# Patient Record
Sex: Male | Born: 1995 | Race: White | Hispanic: No | Marital: Single | Smoking: Never smoker
Health system: Southern US, Community
[De-identification: ages and names within clinical notes are randomized; demographics above are authoritative.]

## PROBLEM LIST (undated history)

## (undated) DIAGNOSIS — G039 Meningitis, unspecified: Secondary | ICD-10-CM

## (undated) DIAGNOSIS — J45909 Unspecified asthma, uncomplicated: Secondary | ICD-10-CM

## (undated) DIAGNOSIS — E119 Type 2 diabetes mellitus without complications: Secondary | ICD-10-CM

## (undated) DIAGNOSIS — I514 Myocarditis, unspecified: Secondary | ICD-10-CM

## (undated) DIAGNOSIS — F845 Asperger's syndrome: Secondary | ICD-10-CM

## (undated) HISTORY — PX: TONSILLECTOMY: SUR1361

## (undated) HISTORY — DX: Asperger's syndrome: F84.5

---

## 2018-03-08 ENCOUNTER — Emergency Department (HOSPITAL_COMMUNITY)
Admission: EM | Admit: 2018-03-08 | Discharge: 2018-03-08 | Disposition: A | Discharge status: Home / Self Care | Payer: Self-pay | Attending: Emergency Medicine | Admitting: Emergency Medicine

## 2018-03-08 ENCOUNTER — Encounter (HOSPITAL_COMMUNITY): Payer: Self-pay | Admitting: Obstetrics and Gynecology

## 2018-03-08 ENCOUNTER — Other Ambulatory Visit: Payer: Self-pay

## 2018-03-08 ENCOUNTER — Emergency Department (HOSPITAL_COMMUNITY): Payer: Self-pay

## 2018-03-08 DIAGNOSIS — J45909 Unspecified asthma, uncomplicated: Secondary | ICD-10-CM | POA: Insufficient documentation

## 2018-03-08 DIAGNOSIS — J209 Acute bronchitis, unspecified: Secondary | ICD-10-CM | POA: Insufficient documentation

## 2018-03-08 HISTORY — DX: Meningitis, unspecified: G03.9

## 2018-03-08 HISTORY — DX: Myocarditis, unspecified: I51.4

## 2018-03-08 HISTORY — DX: Unspecified asthma, uncomplicated: J45.909

## 2018-03-08 MED ORDER — HYDROCOD POLST-CPM POLST ER 10-8 MG/5ML PO SUER
5.0000 mL | Freq: Once | ORAL | Status: AC
  Administered 2018-03-08: 5 mL via ORAL
  Filled 2018-03-08: qty 5

## 2018-03-08 MED ORDER — ALBUTEROL SULFATE HFA 108 (90 BASE) MCG/ACT IN AERS
2.0000 | INHALATION_SPRAY | Freq: Once | RESPIRATORY_TRACT | Status: AC
  Administered 2018-03-08: 2 via RESPIRATORY_TRACT
  Filled 2018-03-08: qty 6.7

## 2018-03-08 MED ORDER — HYDROCODONE-HOMATROPINE 5-1.5 MG/5ML PO SYRP: 5.0000 mL | ORAL_SOLUTION | Freq: Four times a day (QID) | ORAL | 0 refills | Status: DC | PRN

## 2018-03-08 MED ORDER — ALBUTEROL SULFATE (2.5 MG/3ML) 0.083% IN NEBU
5.0000 mg | INHALATION_SOLUTION | Freq: Once | RESPIRATORY_TRACT | Status: AC
  Administered 2018-03-08: 5 mg via RESPIRATORY_TRACT
  Filled 2018-03-08: qty 6

## 2018-03-08 NOTE — Discharge Instructions (Addendum)
Do not drive when taking the cough medication as it will make you sleepy. Return as needed for worsening symptoms.

## 2018-03-08 NOTE — ED Provider Notes (Signed)
Medical screening examination/treatment/procedure(s) were conducted as a shared visit with non-physician practitioner(s) and myself.  I personally evaluated the patient during the encounter.  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  He has done well since then.  He does not have other chronic medical problems.  He developed harsh cough and fever over the weekend and up until yesterday.  There was some vomiting over the weekend as well.  Patient was taking fluids and ibuprofen and Tylenol for presumed flulike illness.  He has not had any fever today but continues to have very harsh deep cough.  He reports is making it hard for him to sleep.  He reports the chest pain is mild and not anything similar to when he had pericarditis.  Patient is alert and appropriate.  No respiratory distress.  Color is good.  He does have dry cough paroxysmal's intermittently.  Lungs are clear.  (I am examining the patient after he had a albuterol nebulizer which reportedly helped quite a bit).  Chest x-ray shows no acute findings.  Appearance is consistent with an acute viral bronchitis/flulike illness.  Patient is already greater than 4 days of symptoms.  Will continue symptomatic treatment with an inhaler, Hycodan and ibuprofen and Tylenol.  Return precautions reviewed.   Arby BarrettePfeiffer, Armon Orvis, MD 03/08/18 2129

## 2018-03-08 NOTE — ED Notes (Signed)
Bed: WLPT4 Expected date:  Expected time:  Means of arrival:  Comments: 

## 2018-03-08 NOTE — ED Notes (Signed)
Bed: [id number] Expected date:  Expected time:  Means of arrival:  Comments: SOB-no distress

## 2018-03-08 NOTE — ED Triage Notes (Signed)
Pt reports he is having a cough and flu like symptoms. Pt's mother is concerned about pneumonia and reports decreased lung sounds. Pt reports a fever of 103 Monday and a 102 fever yesterday.  Pt has a hx of myocarditis and pericarditis.

## 2018-03-08 NOTE — ED Provider Notes (Signed)
[facility]-EMERGENCY DEPT Provider Note   CSN: [id number] Arrival date & time: 03/08/18  1948     History   Chief Complaint Chief Complaint  Patient presents with  . Cough    HPI Wayne Cook is a 22 y.o. male who presents to the ED with his mother for cough, congestion and shortness of breath. Temp up to 103 yesterday. Hx of myocarditis, pericarditis, meningitis and asthma.   HPI  Past Medical History:  Diagnosis Date  . Asthma   . Meningitis   . Myocarditis (HCC)     There are no active problems to display for this patient.   Home Medications    Prior to Admission medications   Medication Sig Start Date End Date Taking? Authorizing Provider  HYDROcodone-homatropine (HYCODAN) 5-1.5 MG/5ML syrup Take 5 mLs by mouth every 6 (six) hours as needed for cough. 03/08/18   Janne Napoleon, NP    Family History No family history on file.  Social History Social History   Tobacco Use  . Smoking status: Never Smoker  . Smokeless tobacco: Never Used  Substance Use Topics  . Alcohol use: Yes    Comment: Social  . Drug use: Not Currently     Allergies   Patient has no allergy information on record.   Review of Systems Review of Systems  Constitutional: Positive for chills and fever.  HENT: Positive for congestion.   Respiratory: Positive for cough and shortness of breath.   Cardiovascular: Negative for chest pain.  Gastrointestinal: Negative for abdominal pain, nausea and vomiting.  Musculoskeletal: Positive for myalgias.  Skin: Negative for rash.  Neurological: Negative for light-headedness.  Psychiatric/Behavioral: Negative for confusion.     Physical Exam Updated Vital Signs BP 130/75 (BP Location: Right Arm)   Pulse (!) 106   Temp 97.9 F (36.6 C) (Oral)   Resp 17   Ht 5\' 11"  (1.803 m)   Wt (!) 140.6 kg   SpO2 98%   BMI 43.24 kg/m   Physical Exam  Constitutional: He appears well-developed and well-nourished. No distress.    HENT:  Head: Normocephalic and atraumatic.  Right Ear: Tympanic membrane normal.  Left Ear: Tympanic membrane normal.  Nose: Rhinorrhea present.  Mouth/Throat: Oropharynx is clear and moist.  Eyes: Conjunctivae and EOM are normal.  Neck: Normal range of motion. Neck supple.  Cardiovascular: Tachycardia present.  Pulmonary/Chest: Effort normal. He has no wheezes. He exhibits no tenderness.  Examined after breathing treatment.  Abdominal: Soft. There is no tenderness.  Musculoskeletal: Normal range of motion.  Lymphadenopathy:    He has no cervical adenopathy.  Neurological: He is alert.  Skin: Skin is warm and dry.  Psychiatric: He has a normal mood and affect.  Nursing note and vitals reviewed.    ED Treatments / Results  Labs (all labs ordered are listed, but only abnormal results are displayed) Labs Reviewed - No data to display Radiology Dg Chest 2 View  Result Date: 03/08/2018 CLINICAL DATA:  Cough. EXAM: CHEST - 2 VIEW COMPARISON:  None. FINDINGS: The heart size and mediastinal contours are within normal limits. Both lungs are clear. The visualized skeletal structures are unremarkable. IMPRESSION: No active cardiopulmonary disease. Electronically Signed   By: Gerome Sam III M.D   On: 03/08/2018 20:57    Procedures Procedures (including critical care time)  Medications Ordered in ED Medications  albuterol (PROVENTIL) (2.5 MG/3ML) 0.083% nebulizer solution 5 mg (5 mg Nebulization Given 03/08/18 2015)  albuterol (PROVENTIL HFA;VENTOLIN HFA) 108 (  90 Base) MCG/ACT inhaler 2 puff (2 puffs Inhalation Given 03/08/18 2152)  chlorpheniramine-HYDROcodone (TUSSIONEX) 10-8 MG/5ML suspension 5 mL (5 mLs Oral Given 03/08/18 2151)     Initial Impression / Assessment and Plan / ED Course  I have reviewed the triage vital signs and the nursing notes. 22 y.o. male with hx of asthma here tonight with shortness of breath that improved with neb treatment. Patient stable for d/c with  normal chest x-ray and O2 SAT 98%  Final Clinical Impressions(s) / ED Diagnoses   Final diagnoses:  Acute bronchitis, unspecified organism    ED Discharge Orders         Ordered    HYDROcodone-homatropine (HYCODAN) 5-1.5 MG/5ML syrup  Every 6 hours PRN     03/08/18 2129           Kerrie Buffaloeese, Anahit Klumb Spring MountM, NP 03/08/18 2218    Arby BarrettePfeiffer, Marcy, MD 03/09/18 2203

## 2018-06-22 ENCOUNTER — Emergency Department (HOSPITAL_COMMUNITY): Payer: No Typology Code available for payment source

## 2018-06-22 ENCOUNTER — Encounter (HOSPITAL_COMMUNITY): Payer: Self-pay | Admitting: Emergency Medicine

## 2018-06-22 ENCOUNTER — Emergency Department (HOSPITAL_COMMUNITY)
Admission: EM | Admit: 2018-06-22 | Discharge: 2018-06-22 | Disposition: A | Discharge status: Home / Self Care | Payer: No Typology Code available for payment source | Attending: Emergency Medicine | Admitting: Emergency Medicine

## 2018-06-22 ENCOUNTER — Other Ambulatory Visit: Payer: Self-pay

## 2018-06-22 DIAGNOSIS — J45909 Unspecified asthma, uncomplicated: Secondary | ICD-10-CM | POA: Diagnosis not present

## 2018-06-22 DIAGNOSIS — R0789 Other chest pain: Secondary | ICD-10-CM | POA: Diagnosis not present

## 2018-06-22 DIAGNOSIS — R079 Chest pain, unspecified: Secondary | ICD-10-CM | POA: Diagnosis present

## 2018-06-22 LAB — I-STAT TROPONIN, ED: Troponin i, poc: 0 ng/mL (ref 0.00–0.08)

## 2018-06-22 LAB — BASIC METABOLIC PANEL
Anion gap: 11 (ref 5–15)
BUN: 8 mg/dL (ref 6–20)
CHLORIDE: 102 mmol/L (ref 98–111)
CO2: 23 mmol/L (ref 22–32)
Calcium: 9.9 mg/dL (ref 8.9–10.3)
Creatinine, Ser: 0.7 mg/dL (ref 0.61–1.24)
GFR calc Af Amer: >60 mL/min (ref >60)
GFR calc non Af Amer: >60 mL/min (ref >60)
Glucose, Bld: 147 mg/dL — ABNORMAL HIGH (ref 70–99)
Potassium: 4.1 mmol/L (ref 3.5–5.1)
Sodium: 136 mmol/L (ref 135–145)

## 2018-06-22 LAB — CBC
HCT: 47.4 % (ref 39.0–52.0)
HEMOGLOBIN: 15.3 g/dL (ref 13.0–17.0)
MCH: 26.9 pg (ref 26.0–34.0)
MCHC: 32.3 g/dL (ref 30.0–36.0)
MCV: 83.5 fL (ref 80.0–100.0)
Platelets: 323 10*3/uL (ref 150–400)
RBC: 5.68 MIL/uL (ref 4.22–5.81)
RDW: 13 % (ref 11.5–15.5)
WBC: 10.7 10*3/uL — ABNORMAL HIGH (ref 4.0–10.5)
nRBC: 0 % (ref 0.0–0.2)

## 2018-06-22 LAB — D-DIMER, QUANTITATIVE: D-Dimer, Quant: <0.27 ug/mL-FEU (ref 0.00–0.50)

## 2018-06-22 MED ORDER — SODIUM CHLORIDE 0.9% FLUSH: 3.0000 mL | Freq: Once | INTRAVENOUS | Status: DC

## 2018-06-22 MED ORDER — ACETAMINOPHEN 500 MG PO TABS
1000.0000 mg | ORAL_TABLET | Freq: Once | ORAL | Status: AC
  Administered 2018-06-22: 1000 mg via ORAL
  Filled 2018-06-22: qty 2

## 2018-06-22 NOTE — ED Triage Notes (Signed)
  Patient comes in with chest pain that started around 1300 this afternoon while playing video games.  Patient states it is in his left chest and does not radiate. No N/V.   Patient was admitted for endocarditis 3 years ago and states it feels like the same.  Patient is A&O x4.

## 2018-06-22 NOTE — Discharge Instructions (Addendum)
May take Tylenol or ibuprofen as needed for pain.  Return immediately for worsening pain, shortness of breath, fever or for any concerns.

## 2018-06-22 NOTE — ED Provider Notes (Addendum)
[facility] EMERGENCY DEPARTMENT Provider Note   CSN: [id number] Arrival date & time: 06/22/18  2009    History   Chief Complaint Chief Complaint  Patient presents with  . Chest Pain    HPI Joh Droge is a 23 y.o. male.     HPI Patient with acute onset left-sided chest pain which started this afternoon at 1400 while playing video games.  Described the pain as sharp and nonradiating.  Denies shortness of breath, cough, fever or chills.  States the pain has improved since being in the emergency department.  No new lower extremity swelling or pain.  No recent extended travel or immobilization.  Patient does have family history of PE.  Has personal history of myocarditis after URI several years ago. Past Medical History:  Diagnosis Date  . Asthma   . Meningitis   . Myocarditis (HCC)     There are no active problems to display for this patient.   Past Surgical History:  Procedure Laterality Date  . TONSILLECTOMY          Home Medications    Prior to Admission medications   Medication Sig Start Date End Date Taking? Authorizing Provider  HYDROcodone-homatropine (HYCODAN) 5-1.5 MG/5ML syrup Take 5 mLs by mouth every 6 (six) hours as needed for cough. Patient not taking: Reported on 06/22/2018 03/08/18   Janne Napoleon, NP    Family History History reviewed. No pertinent family history.  Social History Social History   Tobacco Use  . Smoking status: Never Smoker  . Smokeless tobacco: Never Used  Substance Use Topics  . Alcohol use: Yes    Comment: Social  . Drug use: Not Currently     Allergies   Patient has no known allergies.   Review of Systems Review of Systems  Constitutional: Negative for chills and fever.  HENT: Negative for rhinorrhea, sinus pressure, sore throat and trouble swallowing.   Eyes: Negative for visual disturbance.  Respiratory: Negative for cough and shortness of breath.   Cardiovascular: Positive for chest pain.  Negative for palpitations and leg swelling.  Gastrointestinal: Negative for abdominal pain, diarrhea, nausea and vomiting.  Genitourinary: Negative for dysuria, flank pain and frequency.  Musculoskeletal: Negative for back pain, myalgias and neck pain.  Skin: Negative for rash and wound.  Neurological: Negative for dizziness, weakness, light-headedness, numbness and headaches.  All other systems reviewed and are negative.    Physical Exam Updated Vital Signs BP (!) 108/36   Pulse 91   Temp 98 F (36.7 C) (Oral)   Resp (!) 21   Ht 5\' 11"  (1.803 m)   Wt (!) 140.6 kg   SpO2 97%   BMI 43.24 kg/m   Physical Exam Vitals signs and nursing note reviewed.  Constitutional:      Appearance: Normal appearance. He is well-developed.  HENT:     Head: Normocephalic and atraumatic.     Nose: Nose normal.     Mouth/Throat:     Mouth: Mucous membranes are moist.     Pharynx: No oropharyngeal exudate or posterior oropharyngeal erythema.  Eyes:     Extraocular Movements: Extraocular movements intact.     Pupils: Pupils are equal, round, and reactive to light.  Neck:     Musculoskeletal: Normal range of motion and neck supple. No neck rigidity or muscular tenderness.  Cardiovascular:     Rate and Rhythm: Normal rate and regular rhythm.     Heart sounds: No murmur. No friction rub. No gallop.  Pulmonary:     Effort: Pulmonary effort is normal. No respiratory distress.     Breath sounds: Normal breath sounds. No stridor. No wheezing, rhonchi or rales.     Comments: Patient with left parasternal tenderness to palpation.  No crepitance or deformity. Chest:     Chest wall: Tenderness present.  Abdominal:     General: Bowel sounds are normal.     Palpations: Abdomen is soft.     Tenderness: There is no abdominal tenderness. There is no right CVA tenderness, left CVA tenderness, guarding or rebound.  Musculoskeletal: Normal range of motion.        General: No swelling, tenderness, deformity  or signs of injury.     Right lower leg: No edema.     Left lower leg: No edema.     Comments: No lower extremity swelling, asymmetry or tenderness.  Lymphadenopathy:     Cervical: No cervical adenopathy.  Skin:    General: Skin is warm and dry.     Capillary Refill: Capillary refill takes less than 2 seconds.     Findings: No erythema or rash.  Neurological:     General: No focal deficit present.     Mental Status: He is alert and oriented to person, place, and time.  Psychiatric:        Mood and Affect: Mood normal.        Behavior: Behavior normal.      ED Treatments / Results  Labs (all labs ordered are listed, but only abnormal results are displayed) Labs Reviewed  BASIC METABOLIC PANEL - Abnormal; Notable for the following components:      Result Value   Glucose, Bld 147 (*)    All other components within normal limits  CBC - Abnormal; Notable for the following components:   WBC 10.7 (*)    All other components within normal limits  D-DIMER, QUANTITATIVE (NOT AT [facility])  I-STAT TROPONIN, ED    EKG None  Radiology Dg Chest 2 View  Result Date: 06/22/2018 CLINICAL DATA:  Chest pain EXAM: CHEST - 2 VIEW COMPARISON:  March 08, 2018 FINDINGS: Lungs are clear. The heart size and pulmonary vascularity are normal. No adenopathy. No pneumothorax. No bone lesions. IMPRESSION: No edema or consolidation. Electronically Signed   By: Bretta BangWilliam  Woodruff III M.D.   On: 06/22/2018 21:19    Procedures Procedures (including critical care time)  Medications Ordered in ED Medications  sodium chloride flush (NS) 0.9 % injection 3 mL (3 mLs Intravenous Not Given 06/22/18 2217)  acetaminophen (TYLENOL) tablet 1,000 mg (1,000 mg Oral Given 06/22/18 2208)     Initial Impression / Assessment and Plan / ED Course  I have reviewed the triage vital signs and the nursing notes.  Pertinent labs & imaging results that were available during my care of the patient were reviewed by me and  considered in my medical decision making (see chart for details).        Troponin was drawn 7 hours after onset of pain.  Single negative sufficient to rule out MI.  EKG without ischemic findings.  D-dimer is normal.  Mild elevation in white blood cell count of uncertain significance.  Question possible costochondritis.  Advised NSAID/Tylenol treatment at home.  Return precautions given.  Final Clinical Impressions(s) / ED Diagnoses   Final diagnoses:  Chest wall pain    ED Discharge Orders    None       Loren RacerYelverton, Brewer Hitchman, MD 06/22/18 2149    Ranae PalmsYelverton,  Onalee Hua, MD 06/22/18 2256

## 2018-07-02 ENCOUNTER — Encounter (HOSPITAL_COMMUNITY): Payer: Self-pay

## 2018-07-02 ENCOUNTER — Other Ambulatory Visit: Payer: Self-pay

## 2018-07-02 ENCOUNTER — Emergency Department (HOSPITAL_COMMUNITY)
Admission: EM | Admit: 2018-07-02 | Discharge: 2018-07-02 | Disposition: A | Discharge status: Home / Self Care | Payer: No Typology Code available for payment source | Attending: Emergency Medicine | Admitting: Emergency Medicine

## 2018-07-02 DIAGNOSIS — J45909 Unspecified asthma, uncomplicated: Secondary | ICD-10-CM | POA: Diagnosis not present

## 2018-07-02 DIAGNOSIS — E1165 Type 2 diabetes mellitus with hyperglycemia: Secondary | ICD-10-CM | POA: Diagnosis not present

## 2018-07-02 DIAGNOSIS — N3000 Acute cystitis without hematuria: Secondary | ICD-10-CM | POA: Diagnosis not present

## 2018-07-02 DIAGNOSIS — R350 Frequency of micturition: Secondary | ICD-10-CM | POA: Diagnosis present

## 2018-07-02 DIAGNOSIS — R739 Hyperglycemia, unspecified: Secondary | ICD-10-CM

## 2018-07-02 LAB — BASIC METABOLIC PANEL
Anion gap: 9 (ref 5–15)
BUN: 16 mg/dL (ref 6–20)
CO2: 28 mmol/L (ref 22–32)
Calcium: 9.6 mg/dL (ref 8.9–10.3)
Chloride: 98 mmol/L (ref 98–111)
Creatinine, Ser: 0.81 mg/dL (ref 0.61–1.24)
GFR calc Af Amer: >60 mL/min (ref >60)
GLUCOSE: 330 mg/dL — AB (ref 70–99)
Potassium: 4.5 mmol/L (ref 3.5–5.1)
Sodium: 135 mmol/L (ref 135–145)

## 2018-07-02 LAB — URINALYSIS, ROUTINE W REFLEX MICROSCOPIC
Bacteria, UA: NONE SEEN
Bilirubin Urine: NEGATIVE
Hgb urine dipstick: NEGATIVE
Ketones, ur: NEGATIVE mg/dL
Leukocytes,Ua: NEGATIVE
Nitrite: POSITIVE — AB
Protein, ur: NEGATIVE mg/dL
Specific Gravity, Urine: 1.024 (ref 1.005–1.030)
pH: 6 (ref 5.0–8.0)

## 2018-07-02 LAB — CBC WITH DIFFERENTIAL/PLATELET
Abs Immature Granulocytes: 0.04 10*3/uL (ref 0.00–0.07)
BASOS PCT: 1 %
Basophils Absolute: 0.1 10*3/uL (ref 0.0–0.1)
EOS ABS: 0.2 10*3/uL (ref 0.0–0.5)
Eosinophils Relative: 2 %
HCT: 46.8 % (ref 39.0–52.0)
Hemoglobin: 15 g/dL (ref 13.0–17.0)
Immature Granulocytes: 0 %
Lymphocytes Relative: 30 %
Lymphs Abs: 3.5 10*3/uL (ref 0.7–4.0)
MCH: 27.6 pg (ref 26.0–34.0)
MCHC: 32.1 g/dL (ref 30.0–36.0)
MCV: 86.2 fL (ref 80.0–100.0)
MONOS PCT: 7 %
Monocytes Absolute: 0.8 10*3/uL (ref 0.1–1.0)
Neutro Abs: 6.9 10*3/uL (ref 1.7–7.7)
Neutrophils Relative %: 60 %
PLATELETS: 331 10*3/uL (ref 150–400)
RBC: 5.43 MIL/uL (ref 4.22–5.81)
RDW: 13.3 % (ref 11.5–15.5)
WBC: 11.6 10*3/uL — ABNORMAL HIGH (ref 4.0–10.5)
nRBC: 0 % (ref 0.0–0.2)

## 2018-07-02 LAB — CBG MONITORING, ED: Glucose-Capillary: 334 mg/dL — ABNORMAL HIGH (ref 70–99)

## 2018-07-02 MED ORDER — CEPHALEXIN 500 MG PO CAPS: 500.0000 mg | ORAL_CAPSULE | Freq: Three times a day (TID) | ORAL | 0 refills | Status: DC

## 2018-07-02 MED ORDER — BLOOD GLUCOSE MONITOR KIT: PACK | 0 refills | Status: AC

## 2018-07-02 MED ORDER — CEPHALEXIN 500 MG PO CAPS
500.0000 mg | ORAL_CAPSULE | Freq: Once | ORAL | Status: AC
  Administered 2018-07-02: 500 mg via ORAL
  Filled 2018-07-02: qty 1

## 2018-07-02 MED ORDER — METFORMIN HCL 500 MG PO TABS: 500.0000 mg | ORAL_TABLET | Freq: Two times a day (BID) | ORAL | 0 refills | Status: AC

## 2018-07-02 NOTE — ED Notes (Signed)
Bed: HW38 Expected date:  Expected time:  Means of arrival:  Comments: Patient coming in

## 2018-07-02 NOTE — ED Notes (Signed)
Patient denies pain and is resting comfortably.  

## 2018-07-02 NOTE — Discharge Instructions (Addendum)
Begin taking Keflex and Metformin as prescribed this evening.  Check your blood sugars twice daily and keep a record of this so that you can discuss the results with your primary doctor in follow-up.    Watch your sugar and carbohydrate intake and discuss weight loss strategies with your primary doctor.

## 2018-07-02 NOTE — ED Provider Notes (Signed)
[facility]-EMERGENCY DEPT Provider Note   CSN: [id number] Arrival date & time: 07/02/18  0223    History   Chief Complaint Chief Complaint  Patient presents with  . Urinary Frequency    HPI Wayne Cook is a 23 y.o. male.     Patient is a 23 year old male with history of asthma and myocarditis.  He presents today for evaluation of urinary frequency.  Patient states that beginning this evening he has urinated once every hour.  Every time he lies back, he gets the sudden urge to void.  He is voiding in small amounts.  He denies any fevers or chills.  He denies any burning with urination.  The history is provided by the patient.  Urinary Frequency  This is a new problem. The current episode started 3 to 5 hours ago. The problem occurs constantly. The problem has not changed since onset.Exacerbated by: Lying flat. Nothing relieves the symptoms. Treatments tried: Pyridium. The treatment provided no relief.    Past Medical History:  Diagnosis Date  . Asthma   . Meningitis   . Myocarditis (HCC)     There are no active problems to display for this patient.   Past Surgical History:  Procedure Laterality Date  . TONSILLECTOMY          Home Medications    Prior to Admission medications   Medication Sig Start Date End Date Taking? Authorizing Provider  HYDROcodone-homatropine (HYCODAN) 5-1.5 MG/5ML syrup Take 5 mLs by mouth every 6 (six) hours as needed for cough. Patient not taking: Reported on 06/22/2018 03/08/18   Janne Napoleon, NP    Family History History reviewed. No pertinent family history.  Social History Social History   Tobacco Use  . Smoking status: Never Smoker  . Smokeless tobacco: Never Used  Substance Use Topics  . Alcohol use: Yes    Comment: Social  . Drug use: Not Currently     Allergies   Patient has no known allergies.   Review of Systems Review of Systems  Genitourinary: Positive for frequency.  All other  systems reviewed and are negative.    Physical Exam Updated Vital Signs BP 123/67   Temp 98 F (36.7 C) (Oral)   Resp 17   Physical Exam Vitals signs and nursing note reviewed.  Constitutional:      General: He is not in acute distress.    Appearance: He is well-developed. He is not diaphoretic.  HENT:     Head: Normocephalic and atraumatic.  Neck:     Musculoskeletal: Normal range of motion and neck supple.  Cardiovascular:     Rate and Rhythm: Normal rate and regular rhythm.     Heart sounds: No murmur. No friction rub.  Pulmonary:     Effort: Pulmonary effort is normal. No respiratory distress.     Breath sounds: Normal breath sounds. No wheezing or rales.  Abdominal:     General: Bowel sounds are normal. There is no distension.     Palpations: Abdomen is soft.     Tenderness: There is no abdominal tenderness.  Musculoskeletal: Normal range of motion.  Skin:    General: Skin is warm and dry.  Neurological:     Mental Status: He is alert and oriented to person, place, and time.     Coordination: Coordination normal.      ED Treatments / Results  Labs (all labs ordered are listed, but only abnormal results are displayed) Labs Reviewed  URINALYSIS, ROUTINE W  REFLEX MICROSCOPIC  CBG MONITORING, ED  CBG MONITORING, ED    EKG None  Radiology No results found.  Procedures Procedures (including critical care time)  Medications Ordered in ED Medications - No data to display   Initial Impression / Assessment and Plan / ED Course  I have reviewed the triage vital signs and the nursing notes.  Pertinent labs & imaging results that were available during my care of the patient were reviewed by me and considered in my medical decision making (see chart for details).  Patient presents with urinary frequency that began abruptly this evening.  His urinalysis does show greater than 500 glucose and positive nitrite, however with an unremarkable microscopic.   Electrolyte panel shows no acidosis and is not reflective of significant dehydration.  Patient appears clinically well and I believe appropriate for discharge.  He will be prescribed Keflex for possible UTI and metformin for his elevated blood sugar.  He will also be prescribed a glucometer and test strips and is to keep a record of his blood sugars.  He is to obtain a primary care doctor to discuss dietary modification, weight loss, and medical treatment of what I believed to be a blood sugar diagnostic of type 2 diabetes.  Final Clinical Impressions(s) / ED Diagnoses   Final diagnoses:  None    ED Discharge Orders    None       Geoffery Lyons, MD 07/02/18 [phone]

## 2018-07-02 NOTE — ED Triage Notes (Signed)
Patient arrives with Mother by POV with complaints of urinary frequency. Mother gave pyridium with no relief.

## 2018-07-28 MED FILL — metFORMIN HCL 500 MG TABS: 500 | 90 days supply | Qty: 180 | Fill #0

## 2018-08-01 MED FILL — FREESTYLE LITE TEST STRIP: 66 days supply | Qty: 200 | Fill #0

## 2018-08-02 MED FILL — FREESTYLE LANCETS: 66 days supply | Qty: 200 | Fill #0

## 2018-08-31 ENCOUNTER — Telehealth: Payer: Self-pay

## 2018-08-31 ENCOUNTER — Telehealth: Payer: No Typology Code available for payment source | Admitting: Physician Assistant

## 2018-08-31 DIAGNOSIS — Z20822 Contact with and (suspected) exposure to covid-19: Secondary | ICD-10-CM

## 2018-08-31 DIAGNOSIS — R6889 Other general symptoms and signs: Secondary | ICD-10-CM | POA: Diagnosis not present

## 2018-08-31 MED ORDER — BENZONATATE 100 MG PO CAPS: 100.0000 mg | ORAL_CAPSULE | Freq: Three times a day (TID) | ORAL | 0 refills | Status: DC | PRN

## 2018-08-31 MED ORDER — ONDANSETRON HCL 4 MG PO TABS: 4.0000 mg | ORAL_TABLET | Freq: Three times a day (TID) | ORAL | 0 refills | Status: DC | PRN

## 2018-08-31 NOTE — Progress Notes (Signed)

## 2018-08-31 NOTE — Addendum Note (Signed)
Addended by: Waldon Merl on: 08/31/2018 07:53 PM   Modules accepted: Orders

## 2018-08-31 NOTE — Telephone Encounter (Signed)
Patient filled out the MyChart COVID-19 questions.  Patient reports mild headache, body aches , weakness is worse today, appetite is worse, and vomiting.  Called and spoke with patient informed patient to drink fluids as tolerated, try crackers, soup bread.   Patient reports of having some slight weakness. Patient voiced understanding

## 2018-08-31 NOTE — Progress Notes (Signed)
I have spent 5 minutes in review of e-visit questionnaire, review and updating patient chart, medical decision making and response to patient.   Jantz Main Cody Jeweliana Dudgeon, PA-C    

## 2018-09-01 ENCOUNTER — Emergency Department (HOSPITAL_COMMUNITY)
Admission: EM | Admit: 2018-09-01 | Discharge: 2018-09-01 | Disposition: A | Discharge status: Home / Self Care | Payer: No Typology Code available for payment source | Attending: Emergency Medicine | Admitting: Emergency Medicine

## 2018-09-01 ENCOUNTER — Other Ambulatory Visit: Payer: Self-pay

## 2018-09-01 ENCOUNTER — Emergency Department (HOSPITAL_COMMUNITY): Payer: No Typology Code available for payment source

## 2018-09-01 ENCOUNTER — Encounter (HOSPITAL_COMMUNITY): Payer: Self-pay

## 2018-09-01 DIAGNOSIS — R6510 Systemic inflammatory response syndrome (SIRS) of non-infectious origin without acute organ dysfunction: Secondary | ICD-10-CM | POA: Diagnosis not present

## 2018-09-01 DIAGNOSIS — E119 Type 2 diabetes mellitus without complications: Secondary | ICD-10-CM | POA: Insufficient documentation

## 2018-09-01 DIAGNOSIS — B349 Viral infection, unspecified: Secondary | ICD-10-CM | POA: Insufficient documentation

## 2018-09-01 DIAGNOSIS — J45909 Unspecified asthma, uncomplicated: Secondary | ICD-10-CM | POA: Insufficient documentation

## 2018-09-01 DIAGNOSIS — R509 Fever, unspecified: Secondary | ICD-10-CM | POA: Diagnosis present

## 2018-09-01 DIAGNOSIS — Z79899 Other long term (current) drug therapy: Secondary | ICD-10-CM | POA: Diagnosis not present

## 2018-09-01 DIAGNOSIS — Z20828 Contact with and (suspected) exposure to other viral communicable diseases: Secondary | ICD-10-CM | POA: Insufficient documentation

## 2018-09-01 LAB — CBC WITH DIFFERENTIAL/PLATELET
Abs Immature Granulocytes: 0.08 10*3/uL — ABNORMAL HIGH (ref 0.00–0.07)
Basophils Absolute: 0.1 10*3/uL (ref 0.0–0.1)
Basophils Relative: 0 %
Eosinophils Absolute: 0 10*3/uL (ref 0.0–0.5)
Eosinophils Relative: 0 %
HCT: 45.8 % (ref 39.0–52.0)
Hemoglobin: 14.7 g/dL (ref 13.0–17.0)
Immature Granulocytes: 1 %
Lymphocytes Relative: 8 %
Lymphs Abs: 1.1 10*3/uL (ref 0.7–4.0)
MCH: 27.9 pg (ref 26.0–34.0)
MCHC: 32.1 g/dL (ref 30.0–36.0)
MCV: 87.1 fL (ref 80.0–100.0)
Monocytes Absolute: 1 10*3/uL (ref 0.1–1.0)
Monocytes Relative: 7 %
Neutro Abs: 12.6 10*3/uL — ABNORMAL HIGH (ref 1.7–7.7)
Neutrophils Relative %: 84 %
Platelets: 271 10*3/uL (ref 150–400)
RBC: 5.26 MIL/uL (ref 4.22–5.81)
RDW: 13.2 % (ref 11.5–15.5)
WBC: 14.9 10*3/uL — ABNORMAL HIGH (ref 4.0–10.5)
nRBC: 0 % (ref 0.0–0.2)

## 2018-09-01 LAB — URINALYSIS, ROUTINE W REFLEX MICROSCOPIC
Bilirubin Urine: NEGATIVE
Glucose, UA: NEGATIVE mg/dL
Hgb urine dipstick: NEGATIVE
Ketones, ur: NEGATIVE mg/dL
Leukocytes,Ua: NEGATIVE
Nitrite: NEGATIVE
Protein, ur: NEGATIVE mg/dL
Specific Gravity, Urine: 1.003 — ABNORMAL LOW (ref 1.005–1.030)
pH: 7 (ref 5.0–8.0)

## 2018-09-01 LAB — COMPREHENSIVE METABOLIC PANEL
ALT: 50 U/L — ABNORMAL HIGH (ref 0–44)
AST: 19 U/L (ref 15–41)
Albumin: 3.8 g/dL (ref 3.5–5.0)
Alkaline Phosphatase: 54 U/L (ref 38–126)
Anion gap: 13 (ref 5–15)
BUN: 9 mg/dL (ref 6–20)
CO2: 22 mmol/L (ref 22–32)
Calcium: 9.1 mg/dL (ref 8.9–10.3)
Chloride: 102 mmol/L (ref 98–111)
Creatinine, Ser: 0.9 mg/dL (ref 0.61–1.24)
GFR calc Af Amer: >60 mL/min (ref >60)
GFR calc non Af Amer: >60 mL/min (ref >60)
Glucose, Bld: 150 mg/dL — ABNORMAL HIGH (ref 70–99)
Potassium: 3.7 mmol/L (ref 3.5–5.1)
Sodium: 137 mmol/L (ref 135–145)
Total Bilirubin: 0.6 mg/dL (ref 0.3–1.2)
Total Protein: 7 g/dL (ref 6.5–8.1)

## 2018-09-01 LAB — MONONUCLEOSIS SCREEN: Mono Screen: NEGATIVE

## 2018-09-01 LAB — LACTIC ACID, PLASMA
Lactic Acid, Venous: 2.1 mmol/L (ref 0.5–1.9)
Lactic Acid, Venous: 1.4 mmol/L (ref 0.5–1.9)

## 2018-09-01 LAB — LIPASE, BLOOD: Lipase: 29 U/L (ref 11–51)

## 2018-09-01 LAB — SARS CORONAVIRUS 2 BY RT PCR (HOSPITAL ORDER, PERFORMED IN ~~LOC~~ HOSPITAL LAB): SARS Coronavirus 2: NEGATIVE

## 2018-09-01 MED ORDER — SODIUM CHLORIDE 0.9 % IV BOLUS (SEPSIS)
1000.0000 mL | Freq: Once | INTRAVENOUS | Status: AC
  Administered 2018-09-01: 18:00:00 1000 mL via INTRAVENOUS

## 2018-09-01 MED ORDER — FENTANYL CITRATE (PF) 100 MCG/2ML IJ SOLN
25.0000 ug | Freq: Once | INTRAMUSCULAR | Status: AC
  Administered 2018-09-01: 19:00:00 25 ug via INTRAVENOUS
  Filled 2018-09-01: qty 2

## 2018-09-01 MED ORDER — ONDANSETRON HCL 4 MG/2ML IJ SOLN
4.0000 mg | Freq: Once | INTRAMUSCULAR | Status: AC
  Administered 2018-09-01: 4 mg via INTRAVENOUS
  Filled 2018-09-01: qty 2

## 2018-09-01 MED ORDER — DIPHENHYDRAMINE HCL 50 MG/ML IJ SOLN
25.0000 mg | Freq: Once | INTRAMUSCULAR | Status: AC
  Administered 2018-09-01: 22:00:00 25 mg via INTRAVENOUS
  Filled 2018-09-01: qty 1

## 2018-09-01 MED ORDER — IBUPROFEN 800 MG PO TABS
800.0000 mg | ORAL_TABLET | Freq: Once | ORAL | Status: AC
  Administered 2018-09-01: 19:00:00 800 mg via ORAL
  Filled 2018-09-01: qty 1

## 2018-09-01 MED ORDER — DOXYCYCLINE HYCLATE 100 MG PO CAPS: 100.0000 mg | ORAL_CAPSULE | Freq: Two times a day (BID) | ORAL | 0 refills | Status: DC

## 2018-09-01 MED ORDER — SODIUM CHLORIDE 0.9 % IV BOLUS (SEPSIS)
1000.0000 mL | Freq: Once | INTRAVENOUS | Status: AC
  Administered 2018-09-01: 20:00:00 1000 mL via INTRAVENOUS

## 2018-09-01 MED ORDER — VANCOMYCIN HCL 10 G IV SOLR
2500.0000 mg | Freq: Once | INTRAVENOUS | Status: AC
  Administered 2018-09-01: 20:00:00 2500 mg via INTRAVENOUS
  Filled 2018-09-01: qty 2500

## 2018-09-01 MED ORDER — ACETAMINOPHEN 325 MG PO TABS
650.0000 mg | ORAL_TABLET | Freq: Once | ORAL | Status: AC
  Administered 2018-09-01: 18:00:00 650 mg via ORAL
  Filled 2018-09-01: qty 2

## 2018-09-01 MED ORDER — METRONIDAZOLE IN NACL 5-0.79 MG/ML-% IV SOLN
500.0000 mg | Freq: Once | INTRAVENOUS | Status: AC
  Administered 2018-09-01: 18:00:00 500 mg via INTRAVENOUS
  Filled 2018-09-01: qty 100

## 2018-09-01 MED ORDER — SODIUM CHLORIDE 0.9 % IV BOLUS (SEPSIS): 500.0000 mL | Freq: Once | INTRAVENOUS | Status: DC

## 2018-09-01 MED ORDER — VANCOMYCIN HCL IN DEXTROSE 1-5 GM/200ML-% IV SOLN: 1000.0000 mg | Freq: Once | INTRAVENOUS | Status: DC

## 2018-09-01 MED ORDER — SODIUM CHLORIDE 0.9 % IV SOLN
2.0000 g | Freq: Once | INTRAVENOUS | Status: AC
  Administered 2018-09-01: 18:00:00 2 g via INTRAVENOUS
  Filled 2018-09-01: qty 2

## 2018-09-01 NOTE — ED Notes (Signed)
Patient verbalizes understanding of discharge instructions. Opportunity for questioning and answers were provided. Armband removed by staff, pt discharged from ED.  

## 2018-09-01 NOTE — ED Triage Notes (Signed)
Pt here with mother with complaint of bodyaches, fever, chills, sore throat and vomiting since yesterday. Pt went to see endocrinologist and they pressed on the pt's abd and was tender in the RLQ. Suspected appendiciditis vs covid.

## 2018-09-01 NOTE — Progress Notes (Signed)
Pharmacy Antibiotic Note  Wayne Cook is a 23 y.o. male admitted on 09/01/2018 with sepsis.  Pharmacy has been consulted for Cefepime and Vancomycin dosing.  Vancomycin 2000 mg IV Q 8 hrs. Goal AUC 400-550. Expected AUC: 350 SCr used: 0.9  Plan: Vancomycin 2500 mg IV x 1, 2000 mg IV q8hr Cefepime 2 gms IV q8hr Monitor renal function, C&S, and vanc levels as needed  Height: 5\' 11"  (180.3 cm) Weight: (!) 302 lb (137 kg) IBW/kg (Calculated) : 75.3  Temp (24hrs), Avg:102 F (38.9 C), Min:99.8 F (37.7 C), Max:103.3 F (39.6 C)  Recent Labs  Lab 09/01/18 1715  WBC 14.9*  CREATININE 0.90  LATICACIDVEN 2.1*    Estimated Creatinine Clearance: 182.1 mL/min (by C-G formula based on SCr of 0.9 mg/dL).    No Known Allergies  Antimicrobials this admission: Vanc 5/29 >>  Cefepime 5/29 >>   Thank you for allowing pharmacy to be a part of this patient's care.  Jeanella Cara, PharmD, [facility] Clinical Pharmacist Please see AMION for all Pharmacists' Contact Phone Numbers 09/01/2018, 7:03 PM

## 2018-09-01 NOTE — Discharge Instructions (Addendum)
Contact a health care provider if:  Your symptoms last for 10 days or longer.  Your symptoms get worse over time.  You have a fever.  You have severe sinus pain in your face or forehead.  The glands in your jaw or neck become very swollen.  Get help right away if you:  Feel pain or pressure in your chest.  Have shortness of breath.  Faint or feel like you will faint.  Have severe and persistent vomiting.  Feel confused or disoriented.

## 2018-09-01 NOTE — ED Provider Notes (Signed)
[facility] EMERGENCY DEPARTMENT Provider Note   CSN: [id number] Arrival date & time: 09/01/18  1612    History   Chief Complaint Chief Complaint  Patient presents with  . Sore Throat  . Fever  . Generalized Body Aches    HPI Wayne Cook is a 23 y.o. male sent in from his endocrinologist office.  He has a past medical history of Apsbergers and newly diagnosed DM on metformin 500 mg BID.  He had onset of sore throat and headache last night followed by fever, body aches and chills.  There is continued today along with arthralgias and weakness.  He had one episode of vomiting yesterday.  He denies abdominal pain nausea or diarrhea.  While he was at his endocrinologist office today he states that the evaluator examine his abdomen and poked him in his right lower quadrant with 2 fingers.  He states that it hurt very badly but that he does not feel like there is any pain inside his abdomen then or currently.  He was sent in for rule out of appendicitis versus COVID infection.  His mother is the Surveyor, quantity of [facility] [facility] and is present with him today in the [facility] emergency department and he therefor may have had some exposure to COVID 19.      HPI  Past Medical History:  Diagnosis Date  . Asthma   . Meningitis   . 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   Past Surgical History:  Procedure Laterality Date  . TONSILLECTOMY          Home Medications    Prior to Admission medications   Medication Sig Start Date End Date Taking? Authorizing Provider  benzonatate (TESSALON) 100 MG capsule Take 1 capsule (100 mg total) by mouth 3 (three) times daily as needed for cough. 08/31/18   Brunetta Jeans, PA-C  blood glucose meter kit and supplies KIT Dispense based on patient and insurance preference. Use up to four times daily as directed. (FOR ICD-9 250.00, 250.01). 07/02/18   Veryl Speak, MD   metFORMIN (GLUCOPHAGE) 500 MG tablet Take 1 tablet (500 mg total) by mouth 2 (two) times daily with a meal. 07/02/18   Veryl Speak, MD  ondansetron (ZOFRAN) 4 MG tablet Take 1 tablet (4 mg total) by mouth every 8 (eight) hours as needed for nausea or vomiting. 08/31/18   Brunetta Jeans, PA-C  phenazopyridine (PYRIDIUM) 97 MG tablet Take 97 mg by mouth 3 (three) times daily as needed for pain.    [provider]    Family History History reviewed. No pertinent family history.  Social History Social History   Tobacco Use  . Smoking status: Never Smoker  . Smokeless tobacco: Never Used  Substance Use Topics  . Alcohol use: Yes    Comment: Social  . Drug use: Not Currently     Allergies   Patient has no known allergies.   Review of Systems Review of Systems Ten systems reviewed and are negative for acute change, except as noted in the HPI.    Physical Exam Updated Vital Signs BP (!) 110/53 (BP Location: Right Arm)   Pulse (!) 124   Temp (!) 103.3 F (39.6 C) (Oral)   Resp 16   SpO2 99%   Physical Exam Vitals signs and nursing note reviewed.  Constitutional:      General: He is not in acute distress.  Appearance: He is well-developed. He is obese. He is ill-appearing. He is not toxic-appearing.  HENT:     Head: Normocephalic and atraumatic.     Right Ear: Tympanic membrane normal.     Left Ear: Tympanic membrane normal.     Nose: No congestion.     Mouth/Throat:     Mouth: Mucous membranes are moist.     Pharynx: Uvula midline. Posterior oropharyngeal erythema present. No pharyngeal swelling or oropharyngeal exudate.  Neck:     Musculoskeletal: Neck supple.     Thyroid: No thyromegaly.  Cardiovascular:     Rate and Rhythm: Regular rhythm. Tachycardia present.     Heart sounds: No murmur. No friction rub. No gallop.   Pulmonary:     Effort: Tachypnea present.     Breath sounds: Normal breath sounds. No rhonchi.  Abdominal:     General: There is  no distension.     Palpations: Abdomen is soft.     Tenderness: There is no abdominal tenderness. There is no guarding.  Skin:    General: Skin is warm and dry.     Capillary Refill: Capillary refill takes less than 2 seconds.  Neurological:     General: No focal deficit present.     Mental Status: He is alert and oriented to person, place, and time.      ED Treatments / Results  Labs (all labs ordered are listed, but only abnormal results are displayed) Labs Reviewed - No data to display  EKG None  Radiology No results found.  Procedures Procedures (including critical care time)  Medications Ordered in ED Medications - No data to display   Initial Impression / Assessment and Plan / ED Course  I have reviewed the triage vital signs and the nursing notes.  Pertinent labs & imaging results that were available during my care of the patient were reviewed by me and considered in my medical decision making (see chart for details).  Clinical Course as of Sep 01 2311  Fri Sep 01, 2018  2132 Patient is feeling greatly improved.  His temperature is down to 100.3.  Tachycardia has improved significantly.  Repeat abdominal exam shows no tenderness   [AH]    Clinical Course User Index [AH] Margarita Mail, PA-C       CC:URI/Fever VS: BP (!) 109/53   Pulse (!) 105   Temp (!) 102.9 F (39.4 C) (Oral)   Resp 18   Ht _0  (1.803 m)   Wt (!) 137 kg   SpO2 97%   BMI 42.12 kg/m  EV:OJJKKXF is gathered by patient  and mother. DDX:.ddx includes, viral URI, CAP, COVID-19, Sepsis, SIRS.  Labs: I reviewed the labs which show a cytosis 14.9, normal urinalysis, negative corona testing, CMP shows elevated glucose initial lactic acid 2.1 but improved to normal at 1.4 after fluids.  Blood cultures are pending. Imaging: I personally reviewed the images (1 view chest) which show(s) no acute abnormalities EKG:  EKG Interpretation  Date/Time:  Friday Sep 01 2018 17:03:50 EDT  Ventricular Rate:  120 PR Interval:    QRS Duration: 83 QT Interval:  284 QTC Calculation: 402 R Axis:   11 Text Interpretation:  Sinus tachycardia Confirmed by Lajean Saver [phone]) on 09/01/2018 7:48:41 PM      MDM: Patient here with systemic inflammatory response syndrome.  This may be related to upper respiratory infection.  Although chest x-ray shows no evidence of pneumonia this is also probably a possibility that is just  not showing up on plain film.  Unable to find a bacterial source.  He does not have any rashes, sores.  Negative urine.  Patient mother states that he also had a bug bite which might of been a tick bite.  Patient will be discharged after normalizing vital signs with doxycycline for coverage of both Rickettsia and potential pneumonia.  I have advised the patient's mother to keep a close eye on him.  She is emergency medicine nurse and is very familiar with reasons to seek immediate medical care feel comfortable with him being discharged with close monitoring at home.  Patient is also advised to continue with Motrin and Tylenol alternating between the 2 every 6 hours.  Discussed return precautions. Patient disposition: Discharge Patient condition: Stable. The patient appears reasonably screened and/or stabilized for discharge and I doubt any other medical condition or other [facility] requiring further screening, evaluation, or treatment in the ED at this time prior to discharge. I have discussed lab and/or imaging findings with the patient and answered all questions/concerns to the best of my ability. I have discussed return precautions and OP follow up.   Wayne Cook was evaluated in Emergency Department on 09/01/2018 for the symptoms described in the history of present illness. He was evaluated in the context of the global COVID-19 pandemic, which necessitated consideration that the patient might be at risk for infection with the SARS-CoV-2 virus that causes COVID-19. Institutional  protocols and algorithms that pertain to the evaluation of patients at risk for COVID-19 are in a state of rapid change based on information released by regulatory bodies including the CDC and federal and state organizations. These policies and algorithms were followed during the patient's care in the ED.    Final Clinical Impressions(s) / ED Diagnoses   Final diagnoses:  None    ED Discharge Orders    None       Margarita Mail, PA-C 09/01/18 2326    Lajean Saver, MD 09/02/18 1520

## 2018-09-01 NOTE — ED Notes (Signed)
Urine culture sent to lab with urine sample.  

## 2018-09-01 NOTE — ED Notes (Signed)
ED Provider at bedside. 

## 2018-09-01 NOTE — ED Notes (Signed)
Pt is aware he needs a urine sample. A urinal was placed within reach

## 2018-09-06 LAB — CULTURE, BLOOD (ROUTINE X 2)
Culture: NO GROWTH
Culture: NO GROWTH
Special Requests: ADEQUATE
Special Requests: ADEQUATE

## 2018-09-06 LAB — ROCKY MTN SPOTTED FVR ABS PNL(IGG+IGM)
RMSF IgG: NEGATIVE
RMSF IgM: 0.43 index (ref 0.00–0.89)

## 2018-10-18 MED FILL — metFORMIN HCL 500 MG TABS: 500 | 90 days supply | Qty: 180 | Fill #1

## 2019-02-20 ENCOUNTER — Other Ambulatory Visit: Payer: Self-pay | Admitting: Endocrinology

## 2019-02-20 ENCOUNTER — Ambulatory Visit
Admission: RE | Admit: 2019-02-20 | Discharge: 2019-02-20 | Disposition: A | Discharge status: Home / Self Care | Payer: Commercial Managed Care - PPO | Source: Ambulatory Visit | Attending: Endocrinology | Admitting: Endocrinology

## 2019-02-20 DIAGNOSIS — R1011 Right upper quadrant pain: Secondary | ICD-10-CM

## 2019-02-20 MED ORDER — IOPAMIDOL (ISOVUE-300) INJECTION 61%
125.0000 mL | Freq: Once | INTRAVENOUS | Status: AC | PRN
  Administered 2019-02-20: 125 mL via INTRAVENOUS

## 2019-05-01 ENCOUNTER — Emergency Department (HOSPITAL_COMMUNITY)
Admission: EM | Admit: 2019-05-01 | Discharge: 2019-05-02 | Disposition: A | Discharge status: Home / Self Care | Payer: BC Managed Care – PPO | Attending: Emergency Medicine | Admitting: Emergency Medicine

## 2019-05-01 ENCOUNTER — Encounter (HOSPITAL_COMMUNITY): Payer: Self-pay

## 2019-05-01 ENCOUNTER — Other Ambulatory Visit: Payer: Self-pay

## 2019-05-01 DIAGNOSIS — N451 Epididymitis: Secondary | ICD-10-CM | POA: Insufficient documentation

## 2019-05-01 DIAGNOSIS — Z79899 Other long term (current) drug therapy: Secondary | ICD-10-CM | POA: Diagnosis not present

## 2019-05-01 DIAGNOSIS — Z7984 Long term (current) use of oral hypoglycemic drugs: Secondary | ICD-10-CM | POA: Diagnosis not present

## 2019-05-01 DIAGNOSIS — N50812 Left testicular pain: Secondary | ICD-10-CM | POA: Diagnosis present

## 2019-05-01 DIAGNOSIS — J45909 Unspecified asthma, uncomplicated: Secondary | ICD-10-CM | POA: Diagnosis not present

## 2019-05-01 LAB — URINALYSIS, ROUTINE W REFLEX MICROSCOPIC
Bilirubin Urine: NEGATIVE
Glucose, UA: NEGATIVE mg/dL
Hgb urine dipstick: NEGATIVE
Ketones, ur: NEGATIVE mg/dL
Leukocytes,Ua: NEGATIVE
Nitrite: NEGATIVE
Protein, ur: NEGATIVE mg/dL
Specific Gravity, Urine: 1.026 (ref 1.005–1.030)
pH: 5 (ref 5.0–8.0)

## 2019-05-01 NOTE — ED Triage Notes (Signed)
Patient arrived with complaints of left testicle pain over the last week. Denies any injury, discoloration or swelling. No urinary problems. States it feels almost as if it is "vibrating"

## 2019-05-01 NOTE — ED Provider Notes (Signed)
[facility] DEPT Provider Note   CSN: [id number] Arrival date & time: 05/01/19  2132     History Chief Complaint  Patient presents with  . Testicle Pain    Wayne Cook is a 24 y.o. male.  HPI Patient has been getting sharp intermittent pains in the left testicle for about a week.  No associated flank pain.  No associated fevers or burning with urination.  No swelling of the scrotum.  No pain or burning with urination.  Patient has not been sexually active recently.  No prior history of problems with scrotal or testicular pain.  As a toddler patient had a hydrocele.  Patient is diabetic and takes Metformin.    Past Medical History:  Diagnosis Date  . Asthma   . Meningitis   . 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   Past Surgical History:  Procedure Laterality Date  . TONSILLECTOMY         No family history on file.  Social History   Tobacco Use  . Smoking status: Never Smoker  . Smokeless tobacco: Never Used  Substance Use Topics  . Alcohol use: Yes    Comment: Social  . Drug use: Not Currently    Home Medications Prior to Admission medications   Medication Sig Start Date End Date Taking? Authorizing Provider  benzonatate (TESSALON) 100 MG capsule Take 1 capsule (100 mg total) by mouth 3 (three) times daily as needed for cough. 08/31/18   Brunetta Jeans, PA-C  blood glucose meter kit and supplies KIT Dispense based on patient and insurance preference. Use up to four times daily as directed. (FOR ICD-9 250.00, 250.01). 07/02/18   Veryl Speak, MD  doxycycline (VIBRAMYCIN) 100 MG capsule Take 1 capsule (100 mg total) by mouth 2 (two) times daily. One po bid x 7 days 09/01/18   Margarita Mail, PA-C  doxycycline (VIBRAMYCIN) 100 MG capsule Take 1 capsule (100 mg total) by mouth 2 (two) times daily. One po bid x 7 days 05/02/19   Charlesetta Shanks, MD  metFORMIN (GLUCOPHAGE) 500 MG tablet Take 1  tablet (500 mg total) by mouth 2 (two) times daily with a meal. 07/02/18   Veryl Speak, MD  ondansetron (ZOFRAN) 4 MG tablet Take 1 tablet (4 mg total) by mouth every 8 (eight) hours as needed for nausea or vomiting. 08/31/18   Brunetta Jeans, PA-C  phenazopyridine (PYRIDIUM) 97 MG tablet Take 97 mg by mouth 3 (three) times daily as needed for pain.    [provider]    Allergies    Patient has no known allergies.  Review of Systems   Review of Systems 10 Systems reviewed and are negative for acute change except as noted in the HPI. Physical Exam Updated Vital Signs BP 118/62   Pulse 78   Temp 98.2 F (36.8 C) (Oral)   Resp 16   SpO2 95%   Physical Exam Constitutional:      Comments: Patient is alert and nontoxic.  Clinically well in appearance.  Eyes:     Extraocular Movements: Extraocular movements intact.  Cardiovascular:     Rate and Rhythm: Normal rate and regular rhythm.  Pulmonary:     Effort: Pulmonary effort is normal.     Breath sounds: Normal breath sounds.  Abdominal:     General: There is no distension.     Palpations: Abdomen is soft.     Tenderness: There is no  abdominal tenderness. There is no guarding.  Genitourinary:    Comments: No pain or tenderness to palpation at the inguinal region.  No lymphadenopathy.  Penis normal in appearance.  No scrotal edema.  Mild tenderness of the left testicle to palpation.  It does seem to concentrate at the epididymis.  Testicles are smooth without mass or enlargement.  No cellulitis or erythema of scrotum. Musculoskeletal:        General: No swelling or tenderness. Normal range of motion.     Right lower leg: No edema.     Left lower leg: No edema.  Skin:    General: Skin is warm and dry.  Neurological:     General: No focal deficit present.     Mental Status: He is oriented to person, place, and time.     Coordination: Coordination normal.  Psychiatric:        Mood and Affect: Mood normal.     ED  Results / Procedures / Treatments   Labs (all labs ordered are listed, but only abnormal results are displayed) Labs Reviewed  URINALYSIS, ROUTINE W REFLEX MICROSCOPIC  GC/CHLAMYDIA PROBE AMP ([place]) NOT AT [facility]    EKG None  Radiology US SCROTUM W/DOPPLER  Result Date: 05/02/2019 CLINICAL DATA:  Left testicular pain for 2 weeks EXAM: SCROTAL ULTRASOUND DOPPLER ULTRASOUND OF THE TESTICLES TECHNIQUE: Complete ultrasound examination of the testicles, epididymis, and other scrotal structures was performed. Color and spectral Doppler ultrasound were also utilized to evaluate blood flow to the testicles. COMPARISON:  None. FINDINGS: Right testicle Measurements: 4.1 x 2.8 x 2.9 cm. No mass or microlithiasis visualized. Left testicle Measurements: 4.4 x 2.4 x 2.8 cm. No mass or microlithiasis visualized. Right epididymis:  1.4 cm cyst or spermatocele Left epididymis:  Epididymal tail appears prominent. Hydrocele:  Small left hydrocele Varicocele:  None visualized. Pulsed Doppler interrogation of both testes demonstrates normal low resistance arterial and venous waveforms bilaterally. IMPRESSION: No testicular abnormality. 1.4 cm right epididymal head cyst or spermatocele. Prominence of the left epididymal tail of unknown significance. This conceivably could reflect focal epididymitis. Electronically Signed   By: Rolm Baptise M.D.   On: 05/02/2019 00:47    Procedures Procedures (including critical care time)  Medications Ordered in ED Medications  ketorolac (TORADOL) injection 60 mg (has no administration in time range)  cefTRIAXone (ROCEPHIN) injection 500 mg (has no administration in time range)  doxycycline (VIBRA-TABS) tablet 100 mg (has no administration in time range)    ED Course  I have reviewed the triage vital signs and the nursing notes.  Pertinent labs & imaging results that were available during my care of the patient were reviewed by me and considered in my medical decision  making (see chart for details).    MDM Rules/Calculators/A&P                      Patient has had a week of intermittent lancinating pain isolated to the left testicle.  No flank pain or hematuria to suggest kidney stone.  Ultrasound shows some mild epididymal enlargement.  At this time will opt to treat for epididymitis.  Patient does have history of sexual activity although does not report more recent sexual activity.  He is not having any dysuria, urinalysis is normal.  Will treat empirically with Rocephin and doxycycline.  Ibuprofen as needed for pain.  Patient will follow up with urology for recheck. Final Clinical Impression(s) / ED Diagnoses Final diagnoses:  Epididymitis  Rx / DC Orders ED Discharge Orders         Ordered    doxycycline (VIBRAMYCIN) 100 MG capsule  2 times daily     05/02/19 0107           Charlesetta Shanks, MD 05/02/19 [phone]

## 2019-05-02 ENCOUNTER — Emergency Department (HOSPITAL_COMMUNITY): Payer: BC Managed Care – PPO

## 2019-05-02 MED ORDER — DOXYCYCLINE HYCLATE 100 MG PO TABS
100.0000 mg | ORAL_TABLET | Freq: Once | ORAL | Status: AC
  Administered 2019-05-02: 100 mg via ORAL
  Filled 2019-05-02: qty 1

## 2019-05-02 MED ORDER — DOXYCYCLINE HYCLATE 100 MG PO CAPS: 100.0000 mg | ORAL_CAPSULE | Freq: Two times a day (BID) | ORAL | 0 refills | Status: DC

## 2019-05-02 MED ORDER — CEFTRIAXONE SODIUM 1 G IJ SOLR
500.0000 mg | Freq: Once | INTRAMUSCULAR | Status: AC
  Administered 2019-05-02: 500 mg via INTRAMUSCULAR
  Filled 2019-05-02: qty 10

## 2019-05-02 MED ORDER — KETOROLAC TROMETHAMINE 60 MG/2ML IM SOLN
60.0000 mg | Freq: Once | INTRAMUSCULAR | Status: AC
  Administered 2019-05-02: 01:00:00 60 mg via INTRAMUSCULAR
  Filled 2019-05-02: qty 2

## 2019-05-02 MED ORDER — LIDOCAINE HCL 1 % IJ SOLN
INTRAMUSCULAR | Status: AC
  Administered 2019-05-02: 2.1 mL
  Filled 2019-05-02: qty 20

## 2019-05-02 NOTE — Discharge Instructions (Addendum)
1.  Follow instructions for epididymitis. 2.  You may take ibuprofen 600 mg every 8 hours if needed for pain.  You may also take Tylenol per package instructions as well. 3.  Schedule a follow-up appointment with the urologist. 4.  Return to the emergency department if you are developing worsening symptoms, fever, vomiting or other concerning changes.

## 2019-10-26 ENCOUNTER — Encounter: Payer: Self-pay | Admitting: Emergency Medicine

## 2019-10-26 ENCOUNTER — Other Ambulatory Visit: Payer: Self-pay

## 2019-10-26 ENCOUNTER — Emergency Department
Admission: EM | Admit: 2019-10-26 | Discharge: 2019-10-26 | Disposition: A | Discharge status: Home / Self Care | Payer: BC Managed Care – PPO | Attending: Emergency Medicine | Admitting: Emergency Medicine

## 2019-10-26 DIAGNOSIS — R300 Dysuria: Secondary | ICD-10-CM | POA: Insufficient documentation

## 2019-10-26 DIAGNOSIS — Z7984 Long term (current) use of oral hypoglycemic drugs: Secondary | ICD-10-CM | POA: Insufficient documentation

## 2019-10-26 DIAGNOSIS — J45909 Unspecified asthma, uncomplicated: Secondary | ICD-10-CM | POA: Insufficient documentation

## 2019-10-26 HISTORY — DX: Type 2 diabetes mellitus without complications: E11.9

## 2019-10-26 LAB — CHLAMYDIA/NGC RT PCR (ARMC ONLY)
Chlamydia Tr: NOT DETECTED
N gonorrhoeae: NOT DETECTED

## 2019-10-26 LAB — URINALYSIS, COMPLETE (UACMP) WITH MICROSCOPIC
Bacteria, UA: NONE SEEN
Bilirubin Urine: NEGATIVE
Glucose, UA: NEGATIVE mg/dL
Hgb urine dipstick: NEGATIVE
Ketones, ur: NEGATIVE mg/dL
Leukocytes,Ua: NEGATIVE
Nitrite: NEGATIVE
Protein, ur: NEGATIVE mg/dL
Specific Gravity, Urine: 1.019 (ref 1.005–1.030)
Squamous Epithelial / HPF: NONE SEEN (ref 0–5)
WBC, UA: NONE SEEN WBC/hpf (ref 0–5)
pH: 7 (ref 5.0–8.0)

## 2019-10-26 NOTE — ED Notes (Signed)
See triage note  Presents with burning with urination  States he feels like razor blade type pain

## 2019-10-26 NOTE — ED Triage Notes (Signed)
Pt reports this am he started having pain in his penis when he peed. Pt denies d/c. Pt admits that he is sexually active and does not use protection but denies penile discharge.

## 2019-10-26 NOTE — ED Provider Notes (Signed)
[facility] Emergency Department Provider Note  ____________________________________________   First MD Initiated Contact with Patient 10/26/19 1223     (approximate)  I have reviewed the triage vital signs and the nursing notes.   HISTORY  Chief Complaint Penile Discharge and Urinary Frequency    HPI Wayne Cook is a 24 y.o. male presents emergency department with dysuria that started this morning.  States felt like razor blades.  Patient is sexually active and did not use protection.  Denies penile discharge.  No fever or chills.  No blood in urine.    Past Medical History:  Diagnosis Date  . Asthma   . Diabetes mellitus without complication ([place])   . Meningitis   . 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   Past Surgical History:  Procedure Laterality Date  . TONSILLECTOMY      Prior to Admission medications   Medication Sig Start Date End Date Taking? Authorizing Provider  blood glucose meter kit and supplies KIT Dispense based on patient and insurance preference. Use up to four times daily as directed. (FOR ICD-9 250.00, 250.01). 07/02/18   Veryl Speak, MD  metFORMIN (GLUCOPHAGE) 500 MG tablet Take 1 tablet (500 mg total) by mouth 2 (two) times daily with a meal. 07/02/18   Veryl Speak, MD    Allergies Patient has no known allergies.  No family history on file.  Social History Social History   Tobacco Use  . Smoking status: Never Smoker  . Smokeless tobacco: Never Used  Vaping Use  . Vaping Use: Never used  Substance Use Topics  . Alcohol use: Yes    Comment: Social  . Drug use: Not Currently    Review of Systems  Constitutional: No fever/chills Eyes: No visual changes. ENT: No sore throat. Respiratory: Denies cough Cardiovascular: Denies chest pain Gastrointestinal: Denies abdominal pain Genitourinary: Positive for dysuria. Musculoskeletal: Negative for back pain. Skin: Negative  for rash. Psychiatric: no mood changes,     ____________________________________________   PHYSICAL EXAM:  VITAL SIGNS: ED Triage Vitals  Enc Vitals Group     BP 10/26/19 1120 (!) 136/85     Pulse Rate 10/26/19 1120 88     Resp 10/26/19 1120 20     Temp 10/26/19 1120 98.3 F (36.8 C)     Temp Source 10/26/19 1120 Oral     SpO2 10/26/19 1120 96 %     Weight 10/26/19 1116 (!) 285 lb (129.3 kg)     Height 10/26/19 1116 5' 11" (1.803 m)     Head Circumference --      Peak Flow --      Pain Score 10/26/19 1116 8     Pain Loc --      Pain Edu? --      Excl. in [place]? --     Constitutional: Alert and oriented. Well appearing and in no acute distress. Eyes: Conjunctivae are normal.  Head: Atraumatic. Nose: No congestion/rhinnorhea. Mouth/Throat: Mucous membranes are moist.   Neck:  supple no lymphadenopathy noted Cardiovascular: Normal rate, regular rhythm.  Respiratory: Normal respiratory effort.  No retractions, l Abd: soft nontender bs normal all 4 quad GU: deferred Musculoskeletal: FROM all extremities, warm and well perfused Neurologic:  Normal speech and language.  Skin:  Skin is warm, dry and intact. No rash noted. Psychiatric: Mood and affect are normal. Speech and behavior are normal.  ____________________________________________   LABS (all labs ordered are listed,  but only abnormal results are displayed)  Labs Reviewed  URINALYSIS, COMPLETE (UACMP) WITH MICROSCOPIC - Abnormal; Notable for the following components:      Result Value   Color, Urine YELLOW (*)    APPearance CLEAR (*)    All other components within normal limits  CHLAMYDIA/NGC RT PCR ([facility] ONLY)   ____________________________________________   ____________________________________________  RADIOLOGY    ____________________________________________   PROCEDURES  Procedure(s) performed: No  Procedures    ____________________________________________   INITIAL  IMPRESSION / ASSESSMENT AND PLAN / ED COURSE  Pertinent labs & imaging results that were available during my care of the patient were reviewed by me and considered in my medical decision making (see chart for details).   Patient is 24 year old male presents emergency department with concerns of STD due to dysuria.  See HPI.  Physical exam is unremarkable.  UA is normal, GC/chlamydia is negative.  Explained findings to the patient.  He is to follow-up with his regular doctor if not improving in 2 to 3 days.  Return if worsening.  Is discharged stable condition.      As part of my medical decision making, I reviewed the following data within the [facility] notes reviewed and incorporated, Labs reviewed , Old chart reviewed, Notes from prior ED visits and [place] Controlled Substance Database  ____________________________________________   FINAL CLINICAL IMPRESSION(S) / ED DIAGNOSES  Final diagnoses:  Dysuria      NEW MEDICATIONS STARTED DURING THIS VISIT:  New Prescriptions   No medications on file     Note:  This document was prepared using Dragon voice recognition software and may include unintentional dictation errors.    Versie Starks, PA-C 10/26/19 1427    Harvest Dark, MD 10/26/19 [phone]

## 2019-12-14 ENCOUNTER — Other Ambulatory Visit: Payer: Self-pay | Admitting: Critical Care Medicine

## 2019-12-14 ENCOUNTER — Other Ambulatory Visit: Payer: 59

## 2019-12-14 DIAGNOSIS — Z20822 Contact with and (suspected) exposure to covid-19: Secondary | ICD-10-CM

## 2019-12-17 LAB — NOVEL CORONAVIRUS, NAA: SARS-CoV-2, NAA: NOT DETECTED

## 2020-02-24 ENCOUNTER — Observation Stay
Admission: EM | Admit: 2020-02-24 | Discharge: 2020-02-24 | Disposition: A | Discharge status: Home / Self Care | Payer: 59 | Attending: Hospitalist | Admitting: Hospitalist

## 2020-02-24 ENCOUNTER — Emergency Department: Payer: 59

## 2020-02-24 ENCOUNTER — Encounter: Payer: Self-pay | Admitting: Family Medicine

## 2020-02-24 ENCOUNTER — Other Ambulatory Visit: Payer: Self-pay

## 2020-02-24 DIAGNOSIS — J1282 Pneumonia due to coronavirus disease 2019: Secondary | ICD-10-CM | POA: Diagnosis not present

## 2020-02-24 DIAGNOSIS — Z7984 Long term (current) use of oral hypoglycemic drugs: Secondary | ICD-10-CM | POA: Diagnosis not present

## 2020-02-24 DIAGNOSIS — R0602 Shortness of breath: Secondary | ICD-10-CM | POA: Diagnosis present

## 2020-02-24 DIAGNOSIS — J45909 Unspecified asthma, uncomplicated: Secondary | ICD-10-CM | POA: Diagnosis not present

## 2020-02-24 DIAGNOSIS — E119 Type 2 diabetes mellitus without complications: Secondary | ICD-10-CM | POA: Insufficient documentation

## 2020-02-24 DIAGNOSIS — R509 Fever, unspecified: Secondary | ICD-10-CM

## 2020-02-24 DIAGNOSIS — U071 COVID-19: Secondary | ICD-10-CM | POA: Diagnosis not present

## 2020-02-24 DIAGNOSIS — Z20822 Contact with and (suspected) exposure to covid-19: Secondary | ICD-10-CM | POA: Diagnosis present

## 2020-02-24 DIAGNOSIS — A4189 Other specified sepsis: Secondary | ICD-10-CM | POA: Diagnosis not present

## 2020-02-24 LAB — CBC WITH DIFFERENTIAL/PLATELET
Abs Immature Granulocytes: 0.03 10*3/uL (ref 0.00–0.07)
Basophils Absolute: 0 10*3/uL (ref 0.0–0.1)
Basophils Relative: 0 %
Eosinophils Absolute: 0 10*3/uL (ref 0.0–0.5)
Eosinophils Relative: 0 %
HCT: 44.7 % (ref 39.0–52.0)
Hemoglobin: 14.4 g/dL (ref 13.0–17.0)
Immature Granulocytes: 0 %
Lymphocytes Relative: 15 %
Lymphs Abs: 1.4 10*3/uL (ref 0.7–4.0)
MCH: 26.8 pg (ref 26.0–34.0)
MCHC: 32.2 g/dL (ref 30.0–36.0)
MCV: 83.2 fL (ref 80.0–100.0)
Monocytes Absolute: 0.5 10*3/uL (ref 0.1–1.0)
Monocytes Relative: 6 %
Neutro Abs: 7.3 10*3/uL (ref 1.7–7.7)
Neutrophils Relative %: 79 %
Platelets: 261 10*3/uL (ref 150–400)
RBC: 5.37 MIL/uL (ref 4.22–5.81)
RDW: 13.1 % (ref 11.5–15.5)
WBC: 9.3 10*3/uL (ref 4.0–10.5)
nRBC: 0 % (ref 0.0–0.2)

## 2020-02-24 LAB — COMPREHENSIVE METABOLIC PANEL
ALT: 25 U/L (ref 0–44)
AST: 26 U/L (ref 15–41)
Albumin: 3.7 g/dL (ref 3.5–5.0)
Alkaline Phosphatase: 49 U/L (ref 38–126)
Anion gap: 9 (ref 5–15)
BUN: 12 mg/dL (ref 6–20)
CO2: 23 mmol/L (ref 22–32)
Calcium: 8.6 mg/dL — ABNORMAL LOW (ref 8.9–10.3)
Chloride: 101 mmol/L (ref 98–111)
Creatinine, Ser: 0.88 mg/dL (ref 0.61–1.24)
GFR, Estimated: >60 mL/min (ref >60)
Glucose, Bld: 159 mg/dL — ABNORMAL HIGH (ref 70–99)
Potassium: 4.3 mmol/L (ref 3.5–5.1)
Sodium: 133 mmol/L — ABNORMAL LOW (ref 135–145)
Total Bilirubin: 0.5 mg/dL (ref 0.3–1.2)
Total Protein: 7.6 g/dL (ref 6.5–8.1)

## 2020-02-24 LAB — GLUCOSE, CAPILLARY
Glucose-Capillary: 112 mg/dL — ABNORMAL HIGH (ref 70–99)
Glucose-Capillary: 165 mg/dL — ABNORMAL HIGH (ref 70–99)
Glucose-Capillary: 146 mg/dL — ABNORMAL HIGH (ref 70–99)

## 2020-02-24 LAB — TROPONIN I (HIGH SENSITIVITY)
Troponin I (High Sensitivity): 5 ng/L (ref <18)
Troponin I (High Sensitivity): 4 ng/L (ref <18)

## 2020-02-24 LAB — ABO/RH: ABO/RH(D): A POS

## 2020-02-24 LAB — FIBRINOGEN: Fibrinogen: 498 mg/dL — ABNORMAL HIGH (ref 210–475)

## 2020-02-24 LAB — PROCALCITONIN: Procalcitonin: <0.1 ng/mL

## 2020-02-24 LAB — FERRITIN: Ferritin: 126 ng/mL (ref 24–336)

## 2020-02-24 LAB — LACTIC ACID, PLASMA
Lactic Acid, Venous: 2.1 mmol/L (ref 0.5–1.9)
Lactic Acid, Venous: 0.8 mmol/L (ref 0.5–1.9)

## 2020-02-24 LAB — RESP PANEL BY RT-PCR (FLU A&B, COVID) ARPGX2
Influenza A by PCR: NEGATIVE
Influenza B by PCR: NEGATIVE
SARS Coronavirus 2 by RT PCR: POSITIVE — AB

## 2020-02-24 LAB — HEPATITIS B SURFACE ANTIGEN: Hepatitis B Surface Ag: NONREACTIVE

## 2020-02-24 LAB — LACTATE DEHYDROGENASE: LDH: 176 U/L (ref 98–192)

## 2020-02-24 LAB — HEMOGLOBIN A1C
Hgb A1c MFr Bld: 5.5 % (ref 4.8–5.6)
Mean Plasma Glucose: 111.15 mg/dL

## 2020-02-24 LAB — C-REACTIVE PROTEIN: CRP: 0.6 mg/dL (ref <1.0)

## 2020-02-24 LAB — HIV ANTIBODY (ROUTINE TESTING W REFLEX): HIV Screen 4th Generation wRfx: NONREACTIVE

## 2020-02-24 LAB — BRAIN NATRIURETIC PEPTIDE: B Natriuretic Peptide: 14.7 pg/mL (ref 0.0–100.0)

## 2020-02-24 LAB — FIBRIN DERIVATIVES D-DIMER (ARMC ONLY): Fibrin derivatives D-dimer (ARMC): 422.59 ng/mL (FEU) (ref 0.00–499.00)

## 2020-02-24 MED ORDER — ONDANSETRON HCL 4 MG/2ML IJ SOLN: 4.0000 mg | Freq: Four times a day (QID) | INTRAMUSCULAR | Status: DC | PRN

## 2020-02-24 MED ORDER — ACETAMINOPHEN 500 MG PO TABS
1000.0000 mg | ORAL_TABLET | Freq: Once | ORAL | Status: AC
  Administered 2020-02-24: 1000 mg via ORAL
  Filled 2020-02-24: qty 2

## 2020-02-24 MED ORDER — DIPHENHYDRAMINE HCL 50 MG/ML IJ SOLN: 50.0000 mg | Freq: Once | INTRAMUSCULAR | Status: DC | PRN

## 2020-02-24 MED ORDER — ONDANSETRON HCL 4 MG PO TABS: 4.0000 mg | ORAL_TABLET | Freq: Four times a day (QID) | ORAL | Status: DC | PRN

## 2020-02-24 MED ORDER — SODIUM CHLORIDE 0.9 % IV SOLN: INTRAVENOUS | Status: DC

## 2020-02-24 MED ORDER — GUAIFENESIN-DM 100-10 MG/5ML PO SYRP: 10.0000 mL | ORAL_SOLUTION | ORAL | Status: DC | PRN

## 2020-02-24 MED ORDER — FAMOTIDINE 20 MG PO TABS
20.0000 mg | ORAL_TABLET | Freq: Two times a day (BID) | ORAL | Status: DC
  Administered 2020-02-24: 20 mg via ORAL
  Filled 2020-02-24: qty 1

## 2020-02-24 MED ORDER — METHYLPREDNISOLONE SODIUM SUCC 125 MG IJ SOLR: 125.0000 mg | Freq: Once | INTRAMUSCULAR | Status: DC | PRN

## 2020-02-24 MED ORDER — ALBUTEROL SULFATE HFA 108 (90 BASE) MCG/ACT IN AERS
2.0000 | INHALATION_SPRAY | Freq: Once | RESPIRATORY_TRACT | Status: DC | PRN
  Filled 2020-02-24: qty 6.7

## 2020-02-24 MED ORDER — INSULIN ASPART 100 UNIT/ML ~~LOC~~ SOLN
0.0000 [IU] | Freq: Three times a day (TID) | SUBCUTANEOUS | Status: DC
  Administered 2020-02-24: 13:00:00 3 [IU] via SUBCUTANEOUS
  Filled 2020-02-24: qty 1

## 2020-02-24 MED ORDER — FAMOTIDINE IN NACL 20-0.9 MG/50ML-% IV SOLN: 20.0000 mg | Freq: Once | INTRAVENOUS | Status: DC | PRN

## 2020-02-24 MED ORDER — SODIUM CHLORIDE 0.9 % IV SOLN
200.0000 mg | Freq: Once | INTRAVENOUS | Status: AC
  Administered 2020-02-24: 200 mg via INTRAVENOUS
  Filled 2020-02-24: qty 40

## 2020-02-24 MED ORDER — METHYLPREDNISOLONE SODIUM SUCC 125 MG IJ SOLR
1.0000 mg/kg | Freq: Two times a day (BID) | INTRAMUSCULAR | Status: DC
  Administered 2020-02-24: 124.375 mg via INTRAVENOUS

## 2020-02-24 MED ORDER — EPINEPHRINE 0.3 MG/0.3ML IJ SOAJ
0.3000 mg | Freq: Once | INTRAMUSCULAR | Status: DC | PRN
  Filled 2020-02-24 (×2): qty 0.3

## 2020-02-24 MED ORDER — SODIUM CHLORIDE 0.9 % IV SOLN: INTRAVENOUS | Status: DC | PRN

## 2020-02-24 MED ORDER — HYDROCOD POLST-CPM POLST ER 10-8 MG/5ML PO SUER: 5.0000 mL | Freq: Two times a day (BID) | ORAL | Status: DC | PRN

## 2020-02-24 MED ORDER — SODIUM CHLORIDE 0.9 % IV SOLN: 100.0000 mg | Freq: Every day | INTRAVENOUS | Status: DC

## 2020-02-24 MED ORDER — PREDNISONE 20 MG PO TABS: 50.0000 mg | ORAL_TABLET | Freq: Every day | ORAL | Status: DC

## 2020-02-24 MED ORDER — ACETAMINOPHEN 325 MG PO TABS: 650.0000 mg | ORAL_TABLET | Freq: Four times a day (QID) | ORAL | Status: DC | PRN

## 2020-02-24 MED ORDER — SODIUM CHLORIDE 0.9 % IV SOLN: 200.0000 mg | Freq: Once | INTRAVENOUS | Status: DC

## 2020-02-24 MED ORDER — VITAMIN D 25 MCG (1000 UNIT) PO TABS
1000.0000 [IU] | ORAL_TABLET | Freq: Every day | ORAL | Status: DC
  Administered 2020-02-24: 1000 [IU] via ORAL
  Filled 2020-02-24: qty 1

## 2020-02-24 MED ORDER — OXYCODONE HCL 5 MG PO TABS: 5.0000 mg | ORAL_TABLET | ORAL | Status: DC | PRN

## 2020-02-24 MED ORDER — ASCORBIC ACID 500 MG PO TABS
500.0000 mg | ORAL_TABLET | Freq: Every day | ORAL | Status: DC
  Administered 2020-02-24: 500 mg via ORAL
  Filled 2020-02-24: qty 1

## 2020-02-24 MED ORDER — PREDNISONE 50 MG PO TABS: 50.0000 mg | ORAL_TABLET | Freq: Every day | ORAL | Status: DC

## 2020-02-24 MED ORDER — GUAIFENESIN ER 600 MG PO TB12
600.0000 mg | ORAL_TABLET | Freq: Two times a day (BID) | ORAL | Status: DC
  Administered 2020-02-24: 600 mg via ORAL
  Filled 2020-02-24: qty 1

## 2020-02-24 MED ORDER — METHYLPREDNISOLONE SODIUM SUCC 125 MG IJ SOLR
1.0000 mg/kg | Freq: Two times a day (BID) | INTRAMUSCULAR | Status: DC
  Filled 2020-02-24: qty 2

## 2020-02-24 MED ORDER — SODIUM CHLORIDE 0.9 % IV BOLUS
1000.0000 mL | Freq: Once | INTRAVENOUS | Status: AC
  Administered 2020-02-24: 1000 mL via INTRAVENOUS

## 2020-02-24 MED ORDER — HYDROCOD POLST-CPM POLST ER 10-8 MG/5ML PO SUER: 5.0000 mL | Freq: Two times a day (BID) | ORAL | 0 refills | Status: AC | PRN

## 2020-02-24 MED ORDER — SODIUM CHLORIDE 0.9 % IV SOLN
1200.0000 mg | Freq: Once | INTRAVENOUS | Status: AC
  Administered 2020-02-24: 16:00:00 1200 mg via INTRAVENOUS
  Filled 2020-02-24: qty 10

## 2020-02-24 MED ORDER — ASPIRIN EC 81 MG PO TBEC
81.0000 mg | DELAYED_RELEASE_TABLET | Freq: Every day | ORAL | Status: DC
  Administered 2020-02-24: 81 mg via ORAL
  Filled 2020-02-24: qty 1

## 2020-02-24 MED ORDER — TRAZODONE HCL 50 MG PO TABS: 25.0000 mg | ORAL_TABLET | Freq: Every evening | ORAL | Status: DC | PRN

## 2020-02-24 MED ORDER — ENOXAPARIN SODIUM 60 MG/0.6ML ~~LOC~~ SOLN
60.0000 mg | SUBCUTANEOUS | Status: DC
  Administered 2020-02-24: 60 mg via SUBCUTANEOUS
  Filled 2020-02-24: qty 0.6

## 2020-02-24 MED ORDER — HYDROCOD POLST-CPM POLST ER 10-8 MG/5ML PO SUER
5.0000 mL | Freq: Once | ORAL | Status: AC
  Administered 2020-02-24: 5 mL via ORAL
  Filled 2020-02-24: qty 5

## 2020-02-24 MED ORDER — HYDROCOD POLST-CPM POLST ER 10-8 MG/5ML PO SUER: 5.0000 mL | Freq: Two times a day (BID) | ORAL | 0 refills | Status: DC | PRN

## 2020-02-24 MED ORDER — SODIUM CHLORIDE 0.9 % IV SOLN
Freq: Once | INTRAVENOUS | Status: DC
  Filled 2020-02-24: qty 20

## 2020-02-24 MED ORDER — EPINEPHRINE 0.3 MG/0.3ML IJ SOAJ
0.3000 mg | Freq: Once | INTRAMUSCULAR | Status: DC | PRN
  Filled 2020-02-24: qty 0.3

## 2020-02-24 MED ORDER — ZINC SULFATE 220 (50 ZN) MG PO CAPS
220.0000 mg | ORAL_CAPSULE | Freq: Every day | ORAL | Status: DC
  Administered 2020-02-24: 13:00:00 220 mg via ORAL
  Filled 2020-02-24: qty 1

## 2020-02-24 MED ORDER — MAGNESIUM HYDROXIDE 400 MG/5ML PO SUSP
30.0000 mL | Freq: Every day | ORAL | Status: DC | PRN
  Filled 2020-02-24: qty 30

## 2020-02-24 MED ORDER — SODIUM CHLORIDE 0.9 % IV SOLN: Freq: Once | INTRAVENOUS | Status: DC

## 2020-02-24 NOTE — Progress Notes (Addendum)
Pharmacy COVID-19 Monoclonal Antibody Screening  Wayne Cook was identified as being not hospitalized with symptoms from Covid-19 on admission but an incidental positive PCR has been documented.  The patient may qualify for the use of monoclonal antibodies (mAB) for COVID-19 viral infection to prevent worsening symptoms stemming from Covid-19 infection.  The patient was identified based on a positive COVID-19 PCR and not requiring the use of supplemental oxygen at this time.  This patient meets the FDA criteria for Emergency Use Authorization of casirivimab/imdevimab or bamlanivimab/etesevimab.  Has a (+) direct SARS-CoV-2 viral test result  Is NOT hospitalized due to COVID-19  Is within 10 days of symptom onset  Has at least one of the high risk factor(s) for progression to severe COVID-19 and/or hospitalization as defined in EUA.  Specific high risk criteria : BMI > 25, chronic lung disease (asthma)  Additionally: The patient has not had a positive COVID-19 PCR in the last 90 days.  The patient is fully vaccinated against COVID-19. Pt fully vaccinated in March with [employer]. Has not received booster.   Since the patient is partially or fully vaccinated for COVID-19, has mild symptoms, and meets high risk criteria, the patient is eligible for mAB administration.   This eligibility and indication for treatment was discussed with the patient's physician: MD Fran Lowes  Plan: Based on the above discussion, it was decided that the patient will receive one dose of the available COVID-19 mAB combination. Pharmacy will coordinate administration timing with patient's nurse. Recommended infusion monitoring parameters communicated to the nursing team.  Reatha Armour, PharmD Pharmacy Resident  02/24/2020 11:56 AM

## 2020-02-24 NOTE — ED Notes (Signed)
Date and time results received: 02/24/20 0419   Test: Lactic Critical Value: 2.1  Name of Provider Notified: Dr. Dolores Frame  Orders Received? Or Actions Taken?: Awaiting response

## 2020-02-24 NOTE — Progress Notes (Signed)
Anticoagulation monitoring(Lovenox):  24 yo male ordered Lovenox 40  mg Q24h  Filed Weights   02/24/20 0214  Weight: 124.3 kg (274 lb)   BMI 38.2    Lab Results  Component Value Date   CREATININE 0.88 02/24/2020   CREATININE 0.90 09/01/2018   CREATININE 0.81 07/02/2018   Estimated Creatinine Clearance: 175.2 mL/min (by C-G formula based on SCr of 0.88 mg/dL). Hemoglobin & Hematocrit     Component Value Date/Time   HGB 14.4 02/24/2020 0322   HCT 44.7 02/24/2020 0322     Per Protocol for Patient with estCrcl > 30 ml/min and BMI > 35, will transition to Lovenox 60 mg Q24h.

## 2020-02-24 NOTE — ED Notes (Addendum)
Note entered in error

## 2020-02-24 NOTE — ED Triage Notes (Signed)
Patient with fever, chills, body aches and shortness of breath for the past 3 days.

## 2020-02-24 NOTE — ED Provider Notes (Signed)
[facility] Emergency Department Provider Note   ____________________________________________   First MD Initiated Contact with Patient 02/24/20 [phone]     (approximate)  I have reviewed the triage vital signs and the nursing notes.   HISTORY  Chief Complaint Fever, Generalized Body Aches, and Shortness of Breath    HPI Wayne Cook is a 24 y.o. male who presents to the ED from home with a chief complaint of fever, chills, body aches, cough and shortness of breath.  Symptoms x3 days.  Exposed to coworkers who tested positive for COVID-19.  Patient has been fully vaccinated.  Denies chest pain, abdominal pain, vomiting.  Had some nausea in the lobby.  Experiencing some loose stools.     Past Medical History:  Diagnosis Date  . Asthma   . Diabetes mellitus without complication ([place])   . Meningitis   . 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   Past Surgical History:  Procedure Laterality Date  . TONSILLECTOMY      Prior to Admission medications   Medication Sig Start Date End Date Taking? Authorizing Provider  blood glucose meter kit and supplies KIT Dispense based on patient and insurance preference. Use up to four times daily as directed. (FOR ICD-9 250.00, 250.01). 07/02/18   Veryl Speak, MD  metFORMIN (GLUCOPHAGE) 500 MG tablet Take 1 tablet (500 mg total) by mouth 2 (two) times daily with a meal. 07/02/18   Veryl Speak, MD    Allergies Patient has no known allergies.  No family history on file.  Social History Social History   Tobacco Use  . Smoking status: Never Smoker  . Smokeless tobacco: Never Used  Vaping Use  . Vaping Use: Never used  Substance Use Topics  . Alcohol use: Yes    Comment: Social  . Drug use: Not Currently    Review of Systems  Constitutional: Positive for fever Eyes: No visual changes. ENT: No sore throat. Cardiovascular: Denies chest pain. Respiratory: Positive for  cough and shortness of breath. Gastrointestinal: No abdominal pain.  No nausea, no vomiting.  Positive for diarrhea.  No constipation. Genitourinary: Negative for dysuria. Musculoskeletal: Negative for back pain. Skin: Negative for rash. Neurological: Negative for headaches, focal weakness or numbness.   ____________________________________________   PHYSICAL EXAM:  VITAL SIGNS: ED Triage Vitals  Enc Vitals Group     BP 02/24/20 0218 134/87     Pulse Rate 02/24/20 0218 (!) 133     Resp 02/24/20 0218 (!) 22     Temp 02/24/20 0218 (!) 100.6 F (38.1 C)     Temp Source 02/24/20 0218 Oral     SpO2 02/24/20 0218 95 %     Weight 02/24/20 0214 274 lb (124.3 kg)     Height 02/24/20 0214 $RemoveBefor'5\' 11"'[id number]$  (1.803 m)     Head Circumference --      Peak Flow --      Pain Score --      Pain Loc --      Pain Edu? --      Excl. in [place]? --     Constitutional: Alert and oriented. Well appearing and in mild acute distress. Eyes: Conjunctivae are normal. PERRL. EOMI. Head: Atraumatic. Nose: No congestion/rhinnorhea. Mouth/Throat: Mucous membranes are mildly dry.   Neck: No stridor.   Cardiovascular: Tachycardic rate, regular rhythm. Grossly normal heart sounds.  Good peripheral circulation. Respiratory: Normal respiratory effort.  No retractions. Lungs mildly diminished bibasilarly. Gastrointestinal:  Soft and nontender to light or deep palpation. No distention. No abdominal bruits. No CVA tenderness. Musculoskeletal: No lower extremity tenderness nor edema.  No joint effusions. Neurologic:  Normal speech and language. No gross focal neurologic deficits are appreciated.  Skin:  Skin is warm, dry and intact. No rash noted.  No petechiae. Psychiatric: Mood and affect are normal. Speech and behavior are normal.  ____________________________________________   LABS (all labs ordered are listed, but only abnormal results are displayed)  Labs Reviewed  RESP PANEL BY RT-PCR (FLU A&B, COVID) ARPGX2 -  Abnormal; Notable for the following components:      Result Value   SARS Coronavirus 2 by RT PCR POSITIVE (*)    All other components within normal limits  LACTIC ACID, PLASMA - Abnormal; Notable for the following components:   Lactic Acid, Venous 2.1 (*)    All other components within normal limits  COMPREHENSIVE METABOLIC PANEL - Abnormal; Notable for the following components:   Sodium 133 (*)    Glucose, Bld 159 (*)    Calcium 8.6 (*)    All other components within normal limits  CULTURE, BLOOD (ROUTINE X 2)  CULTURE, BLOOD (ROUTINE X 2)  CBC WITH DIFFERENTIAL/PLATELET  LACTIC ACID, PLASMA  HIV ANTIBODY (ROUTINE TESTING W REFLEX)  BRAIN NATRIURETIC PEPTIDE  C-REACTIVE PROTEIN  FIBRIN DERIVATIVES D-DIMER ([facility] ONLY)  FERRITIN  FIBRINOGEN  HEPATITIS B SURFACE ANTIGEN  LACTATE DEHYDROGENASE  PROCALCITONIN  ABO/RH   ____________________________________________  EKG  ED ECG REPORT I, Meagan Spease J, the attending physician, personally viewed and interpreted this ECG.   Date: 02/24/2020  EKG Time: 0351  Rate: 111  Rhythm: sinus tachycardia  Axis: Normal  Intervals:none  ST&T Change: Nonspecific  ____________________________________________  RADIOLOGY I, Angelyn Osterberg J, personally viewed and evaluated these images (plain radiographs) as part of my medical decision making, as well as reviewing the written report by the radiologist.  ED MD interpretation: Suspicious for COVID-19 pneumonia  Official radiology report(s): DG Chest 2 View  Result Date: 02/24/2020 CLINICAL DATA:  Shortness of breath, fevers, chills and body aches EXAM: CHEST - 2 VIEW COMPARISON:  Radiograph 09/01/2018 FINDINGS: Mild airways thickening few ill-defined patchy opacities. No pneumothorax or visible effusion. The cardiomediastinal contours are unremarkable. No acute osseous or soft tissue abnormality. IMPRESSION: Mild airways thickening and few ill-defined patchy opacities, could reflect early  bronchopneumonia or atypical infection in the appropriate clinical setting. Electronically Signed   By: Lovena Le M.D.   On: 02/24/2020 03:06    ____________________________________________   PROCEDURES  Procedure(s) performed (including Critical Care):  .1-3 Lead EKG Interpretation Performed by: Paulette Blanch, MD Authorized by: Paulette Blanch, MD     Interpretation: abnormal     ECG rate:  133   ECG rate assessment: tachycardic     Rhythm: sinus tachycardia     Ectopy: none     Conduction: normal   Comments:     Patient placed on cardiac monitor to evaluate for arrhythmias     ____________________________________________   INITIAL IMPRESSION / ASSESSMENT AND PLAN / ED COURSE  As part of my medical decision making, I reviewed the following data within the [place] notes reviewed and incorporated, Labs reviewed, EKG interpreted, Old chart reviewed (visits for suspected COVID-19), Radiograph reviewed  and Notes from prior ED visits (minor care visits, hyperglycemia)     24 year old male with diabetes presenting with fever, cough, shortness of breath. Differential includes, but is not limited to, viral syndrome,  bronchitis including COPD exacerbation, pneumonia, reactive airway disease including asthma, CHF including exacerbation with or without pulmonary/interstitial edema, pneumothorax, ACS, thoracic trauma, and pulmonary embolism.  Chest x-ray and respiratory panel pending.  Given patient's tachycardia, will obtain lab work, administer IV fluids, Tylenol for low-grade fever and reassess.  Clinical Course as of Feb 23 458  Sun Feb 24, 2020  0410 RA saturations on ambulation 93%, unable to take more than 4 steps without HR increasing to 135 and RR uppers 20s.   [JS]  8756 IV remdesivir and Solu-Medrol ordered.  Will discuss with hospitalist services for admission.   [JS]    Clinical Course User Index [JS] Paulette Blanch, MD      ____________________________________________   FINAL CLINICAL IMPRESSION(S) / ED DIAGNOSES  Final diagnoses:  Fever, unspecified fever cause  COVID-19  Pneumonia due to COVID-19 virus     ED Discharge Orders    None      *Please note:  Wayne Cook was evaluated in Emergency Department on 02/24/2020 for the symptoms described in the history of present illness. He was evaluated in the context of the global COVID-19 pandemic, which necessitated consideration that the patient might be at risk for infection with the SARS-CoV-2 virus that causes COVID-19. Institutional protocols and algorithms that pertain to the evaluation of patients at risk for COVID-19 are in a state of rapid change based on information released by regulatory bodies including the CDC and federal and state organizations. These policies and algorithms were followed during the patient's care in the ED.  Some ED evaluations and interventions may be delayed as a result of limited staffing during and the pandemic.*   Note:  This document was prepared using Dragon voice recognition software and may include unintentional dictation errors.   Paulette Blanch, MD 02/24/20 [phone]

## 2020-02-24 NOTE — H&P (Signed)
[place]   PATIENT NAME: Wayne Cook    MR#:  [id number]  DATE OF BIRTH:  [birth year]  DATE OF ADMISSION:  02/24/2020  PRIMARY CARE PHYSICIAN: Reynold Bowen, MD   REQUESTING/REFERRING PHYSICIAN: Lurline Hare, MD  CHIEF COMPLAINT:   Chief Complaint  Patient presents with  . Fever  . Generalized Body Aches  . Shortness of Breath    HISTORY OF PRESENT ILLNESS:  Dessie Delcarlo  is a 24 y.o. obese Caucasian male with a known history of asthma, type II diabetes mellitus, pericarditis and myocarditis, who presented to the emergency room with acute onset of worsening dyspnea over the last 3 days with associated dry cough without wheezing.  He admitted to generalized weakness as well as fever that was up to 100.8 with associated chills.  He has been having nausea without vomiting as well as mild diarrhea.  He received his Covid [employer] vaccine back in March without boosters.  He denied any chest pain or palpitations.  No dysuria, oliguria or hematuria or flank pain.  He cachectic and tachycardic upon ambulating for a few steps.  Upon presentation to the emergency room, temperature was 100.6 with heart rate of 133 and respiratory to 22.  Pulse oximetry has been ranging from 94-97.  Labs revealed a blood glucose of 139 and mild hyponatremia 133 with otherwise unremarkable CMP.  Lactic acid was 2.1 and CBC was within normal.  Influenza antigens came back negative.  COVID-19 PCR came back positive.  Blood culture was sent.  Two-view chest x-ray showed mild airway thickening seen few ill-defined patchy opacities that could reflect early bronchopneumonia or atypical infection. EKG showed sinus tachycardia with rate 111.  The patient was given 1 g of p.o. Tylenol, Tussionex, 125 mg of IV Solu-Medrol as well as IV remdesivir and 1 L bolus of IV normal saline.  He will be admitted to an isolation medically monitored bed for further evaluation and management.  PAST MEDICAL HISTORY:    Past Medical History:  Diagnosis Date  . Asthma   . Diabetes mellitus without complication ([place])   . Meningitis   . 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 TONSILLECTOMY      SOCIAL HISTORY:   Social History   Tobacco Use  . Smoking status: Never Smoker  . Smokeless tobacco: Never Used  Substance Use Topics  . Alcohol use: Yes    Comment: Social    FAMILY HISTORY:  Positive diabetes mellitus in his grandfather.  DRUG ALLERGIES:  No Known Allergies  REVIEW OF SYSTEMS:   ROS As per history of present illness. All pertinent systems were reviewed above. Constitutional, HEENT, cardiovascular, respiratory, GI, GU, musculoskeletal, neuro, psychiatric, endocrine, integumentary and hematologic systems were reviewed and are otherwise negative/unremarkable except for positive findings mentioned above in the HPI.   MEDICATIONS AT HOME:   Prior to Admission medications   Medication Sig Start Date End Date Taking? Authorizing Provider  blood glucose meter kit and supplies KIT Dispense based on patient and insurance preference. Use up to four times daily as directed. (FOR ICD-9 250.00, 250.01). 07/02/18   Veryl Speak, MD  metFORMIN (GLUCOPHAGE) 500 MG tablet Take 1 tablet (500 mg total) by mouth 2 (two) times daily with a meal. 07/02/18   Veryl Speak, MD      VITAL SIGNS:  Blood pressure 129/81, pulse (!) 117, temperature (!) 100.6 F (38.1 C),  temperature source Oral, resp. rate 16, height $RemoveBe'5\' 11"'ppYzHEUcV$  (1.803 m), weight 124.3 kg, SpO2 95 %.  PHYSICAL EXAMINATION:  Physical Exam  GENERAL:  24 y.o.-year-old Caucasian male patient lying in the bed with minimal respiratory distress with mild conversational dyspnea. EYES: Pupils equal, round, reactive to light and accommodation. No scleral icterus. Extraocular muscles intact.  HEENT: Head atraumatic, normocephalic. Oropharynx and nasopharynx clear.  NECK:  Supple, no  jugular venous distention. No thyroid enlargement, no tenderness.  LUNGS: Diminished bibasal breath sounds with bibasal crackles. CARDIOVASCULAR: Regular rate and rhythm, S1, S2 normal. No murmurs, rubs, or gallops.  ABDOMEN: Soft, nondistended, nontender. Bowel sounds present. No organomegaly or mass.  EXTREMITIES: No pedal edema, cyanosis, or clubbing.  NEUROLOGIC: Cranial nerves II through XII are intact. Muscle strength 5/5 in all extremities. Sensation intact. Gait not checked.  PSYCHIATRIC: The patient is alert and oriented x 3.  Normal affect and good eye contact. SKIN: No obvious rash, lesion, or ulcer.   LABORATORY PANEL:   CBC Recent Labs  Lab 02/24/20 0322  WBC 9.3  HGB 14.4  HCT 44.7  PLT 261   ------------------------------------------------------------------------------------------------------------------  Chemistries  Recent Labs  Lab 02/24/20 0322  NA 133*  K 4.3  CL 101  CO2 23  GLUCOSE 159*  BUN 12  CREATININE 0.88  CALCIUM 8.6*  AST 26  ALT 25  ALKPHOS 49  BILITOT 0.5   ------------------------------------------------------------------------------------------------------------------  Cardiac Enzymes No results for input(s): TROPONINI in the last 168 hours. ------------------------------------------------------------------------------------------------------------------  RADIOLOGY:  DG Chest 2 View  Result Date: 02/24/2020 CLINICAL DATA:  Shortness of breath, fevers, chills and body aches EXAM: CHEST - 2 VIEW COMPARISON:  Radiograph 09/01/2018 FINDINGS: Mild airways thickening few ill-defined patchy opacities. No pneumothorax or visible effusion. The cardiomediastinal contours are unremarkable. No acute osseous or soft tissue abnormality. IMPRESSION: Mild airways thickening and few ill-defined patchy opacities, could reflect early bronchopneumonia or atypical infection in the appropriate clinical setting. Electronically Signed   By: Lovena Le M.D.    On: 02/24/2020 03:06      IMPRESSION AND PLAN:  1.  Multifocal pneumonia and subsequent sepsis secondary to COVID-19 without severe sepsis or septic shock.  This is manifested by tachycardia and tachypnea. -The patient will be admitted to an isolation monitored bed with droplet and contact precautions. -Given multifocal pneumonia we will empirically place the patient on IV Rocephin and Zithromax for possible bacterial superinfection only with elevated Procalcitonin. -The patient will be placed on scheduled Mucinex and as needed Tussionex. -We will avoid nebulization as much as we can, give bronchodilator MDI if needed, and with deterioration of oxygenation try to avoid BiPAP/CPAP if possible.    -Will obtain sputum Gram stain culture and sensitivity and follow blood cultures. -O2 protocol will be followed. -We will follow CRP, ferritin, LDH and D-dimer. -Will follow manual differential for ANC/ALC ratio as well as follow troponin I and daily CBC with manual differential and CMP. - Will place the patient on IV Remdesivir and IV steroid therapy with IV Solu-Medrol with elevated inflammatory markers. -The patient will be placed on vitamin D3, vitamin C, zinc sulfate, p.o. Pepcid and aspirin.  2.  Type 2 diabetes mellitus. -The patient will be placed on supplement coverage with NovoLog and will hold off Metformin for now.  3.  DVT prophylaxis. -Subcutaneous Lovenox   All the records are reviewed and case discussed with ED provider. The plan of care was discussed in details with the patient (and family). I answered all  questions. The patient agreed to proceed with the above mentioned plan. Further management will depend upon hospital course.   CODE STATUS: Full code  Status is: Inpatient  Remains inpatient appropriate because:Ongoing diagnostic testing needed not appropriate for outpatient work up, Unsafe d/c plan, IV treatments appropriate due to intensity of illness or inability to  take PO and Inpatient level of care appropriate due to severity of illness   Dispo: The patient is from: Home              Anticipated d/c is to: Home              Anticipated d/c date is: 3 days              Patient currently is not medically stable to d/c.   TOTAL TIME TAKING CARE OF THIS PATIENT: 55 minutes.    Christel Mormon M.D on 02/24/2020 at 5:27 AM  [facility]   From 7 PM-7 AM, contact night-coverage [email or web address]  CC: Primary care physician; Reynold Bowen, MD

## 2020-02-24 NOTE — Discharge Summary (Signed)
Physician Discharge Summary   Wayne Cook  male DOB: Feb 04, 1996  KXF:[id number]  PCP: Reynold Bowen, MD  Admit date: 02/24/2020 Discharge date: 02/24/2020  Admitted From: home Disposition:  home CODE STATUS: Full code  Discharge Instructions    Discharge instructions   Complete by: As directed    Because you were vaccinated, your COVID infection currently is not severe, and not needing any supplemental oxygen or inpatient treatment, so you can go home to recover.  We have given you the monoclonal antibodies (mAb) to help reduce the risk of you getting sicker.     Dr. Enzo Bi [facility] Course:  For full details, please see H&P, progress notes, consult notes and ancillary notes.  Briefly,  Wayne Cook  is a 24 y.o. obese Caucasian male with a known history of asthma, type II diabetes mellitus, pericarditis and myocarditis, who presented to the emergency room with acute onset of worsening dyspnea over the last 3 days with associated dry cough without wheezing.  He admitted to generalized weakness as well as fever that was up to 100.8 with associated chills.  He has been having nausea without vomiting as well as mild diarrhea.  He received his Covid [employer] vaccine back in March without boosters.   # COVID-19 infection Pt had no hypoxia on presentation.  CRP low at 0.6.  Pt was tolerating oral intake.  Pt received monoclonal mAb and was discharged in stable condition.  Rx for Tussionex provided at discharge.  # Sepsis ruled out  # DM2 Pt discharged on home metformin.    Discharge Diagnoses:  Active Problems:   Suspected COVID-19 virus infection   COVID-19 virus infection    Discharge Instructions:  Allergies as of 02/24/2020   No Known Allergies     Medication List    TAKE these medications   blood glucose meter kit and supplies Kit Dispense based on patient and insurance preference. Use up to four times daily as directed. (FOR ICD-9  250.00, 250.01).   chlorpheniramine-HYDROcodone 10-8 MG/5ML Suer Commonly known as: TUSSIONEX Take 5 mLs by mouth every 12 (twelve) hours as needed for up to 7 days for cough.   metFORMIN 500 MG tablet Commonly known as: GLUCOPHAGE Take 1 tablet (500 mg total) by mouth 2 (two) times daily with a meal.        Follow-up Information    Reynold Bowen, MD. Schedule an appointment as soon as possible for a visit in 1 week(s).   Specialty: Endocrinology Contact information: [address] [place] [address] [phone]               No Known Allergies   The results of significant diagnostics from this hospitalization (including imaging, microbiology, ancillary and laboratory) are listed below for reference.   Consultations:   Procedures/Studies: DG Chest 2 View  Result Date: 02/24/2020 CLINICAL DATA:  Shortness of breath, fevers, chills and body aches EXAM: CHEST - 2 VIEW COMPARISON:  Radiograph 09/01/2018 FINDINGS: Mild airways thickening few ill-defined patchy opacities. No pneumothorax or visible effusion. The cardiomediastinal contours are unremarkable. No acute osseous or soft tissue abnormality. IMPRESSION: Mild airways thickening and few ill-defined patchy opacities, could reflect early bronchopneumonia or atypical infection in the appropriate clinical setting. Electronically Signed   By: Lovena Le M.D.   On: 02/24/2020 03:06      Labs: BNP (last 3 results) Recent Labs    02/24/20 0545  BNP 14.7  Basic Metabolic Panel: Recent Labs  Lab 02/24/20 0322  NA 133*  K 4.3  CL 101  CO2 23  GLUCOSE 159*  BUN 12  CREATININE 0.88  CALCIUM 8.6*   Liver Function Tests: Recent Labs  Lab 02/24/20 0322  AST 26  ALT 25  ALKPHOS 49  BILITOT 0.5  PROT 7.6  ALBUMIN 3.7   No results for input(s): LIPASE, AMYLASE in the last 168 hours. No results for input(s): AMMONIA in the last 168 hours. CBC: Recent Labs  Lab 02/24/20 0322  WBC 9.3  NEUTROABS  7.3  HGB 14.4  HCT 44.7  MCV 83.2  PLT 261   Cardiac Enzymes: No results for input(s): CKTOTAL, CKMB, CKMBINDEX, TROPONINI in the last 168 hours. BNP: Invalid input(s): POCBNP CBG: Recent Labs  Lab 02/24/20 0852  GLUCAP 112*   D-Dimer No results for input(s): DDIMER in the last 72 hours. Hgb A1c Recent Labs    02/24/20 0545  HGBA1C 5.5   Lipid Profile No results for input(s): CHOL, HDL, LDLCALC, TRIG, CHOLHDL, LDLDIRECT in the last 72 hours. Thyroid function studies No results for input(s): TSH, T4TOTAL, T3FREE, THYROIDAB in the last 72 hours.  Invalid input(s): FREET3 Anemia work up Recent Labs    02/24/20 0545  FERRITIN 126   Urinalysis    Component Value Date/Time   COLORURINE YELLOW (A) 10/26/2019 1157   APPEARANCEUR CLEAR (A) 10/26/2019 1157   LABSPEC 1.019 10/26/2019 1157   PHURINE 7.0 10/26/2019 [place] 10/26/2019 1157   HGBUR NEGATIVE 10/26/2019 1157   BILIRUBINUR NEGATIVE 10/26/2019 1157   KETONESUR NEGATIVE 10/26/2019 1157   PROTEINUR NEGATIVE 10/26/2019 1157   NITRITE NEGATIVE 10/26/2019 1157   LEUKOCYTESUR NEGATIVE 10/26/2019 1157   Sepsis Labs Invalid input(s): PROCALCITONIN,  WBC,  LACTICIDVEN Microbiology Recent Results (from the past 240 hour(s))  Resp Panel by RT-PCR (Flu A&B, Covid) Nasopharyngeal Swab     Status: Abnormal   Collection Time: 02/24/20  2:23 AM   Specimen: Nasopharyngeal Swab; Nasopharyngeal(NP) swabs in vial transport medium  Result Value Ref Range Status   SARS Coronavirus 2 by RT PCR POSITIVE (A) NEGATIVE Final    Comment: RESULT CALLED TO, READ BACK BY AND VERIFIED WITH: Susette Racer RN AT [id number] ON 02/24/20 SNG  (NOTE) SARS-CoV-2 target nucleic acids are DETECTED.  The SARS-CoV-2 RNA is generally detectable in upper respiratory specimens during the acute phase of infection. Positive results are indicative of the presence of the identified virus, but do not rule out bacterial infection or  co-infection with other pathogens not detected by the test. Clinical correlation with patient history and other diagnostic information is necessary to determine patient infection status. The expected result is Negative.  Fact Sheet for Patients: EntrepreneurPulse.com.au  Fact Sheet for Healthcare Providers: IncredibleEmployment.be  This test is not yet approved or cleared by the [place] FDA and  has been authorized for detection and/or diagnosis of SARS-CoV-2 by FDA under an Emergency Use Authorization (EUA).  This EUA will remain in effect (meaning this  test can be used) for the duration of  the COVID-19 declaration under Section 564(b)(1) of the Act, 21 U.S.C. section 360bbb-3(b)(1), unless the authorization is terminated or revoked sooner.     Influenza A by PCR NEGATIVE NEGATIVE Final   Influenza B by PCR NEGATIVE NEGATIVE Final    Comment: (NOTE) The Xpert Xpress SARS-CoV-2/FLU/RSV plus assay is intended as an aid in the diagnosis of influenza from Nasopharyngeal swab specimens and should not be used  as a sole basis for treatment. Nasal washings and aspirates are unacceptable for Xpert Xpress SARS-CoV-2/FLU/RSV testing.  Fact Sheet for Patients: EntrepreneurPulse.com.au  Fact Sheet for Healthcare Providers: IncredibleEmployment.be  This test is not yet approved or cleared by the [place] FDA and has been authorized for detection and/or diagnosis of SARS-CoV-2 by FDA under an Emergency Use Authorization (EUA). This EUA will remain in effect (meaning this test can be used) for the duration of the COVID-19 declaration under Section 564(b)(1) of the Act, 21 U.S.C. section 360bbb-3(b)(1), unless the authorization is terminated or revoked.  Performed at [facility], [address] [place], [place] [address]   Culture, blood (routine x 2)     Status: None (Preliminary result)    Collection Time: 02/24/20  3:22 AM   Specimen: BLOOD  Result Value Ref Range Status   Specimen Description BLOOD BLOOD RIGHT FOREARM  Final   Special Requests   Final    BOTTLES DRAWN AEROBIC AND ANAEROBIC Blood Culture adequate volume   Culture   Final    NO GROWTH < 12 HOURS Performed at [facility], [address] [place], [place] [address]    Report Status PENDING  Incomplete     Total time spend on discharging this patient, including the last patient exam, discussing the hospital stay, instructions for ongoing care as it relates to all pertinent caregivers, as well as preparing the medical discharge records, prescriptions, and/or referrals as applicable, is 35 minutes.    Enzo Bi, MD  [facility] 02/24/2020, 11:18 AM

## 2020-02-24 NOTE — Progress Notes (Signed)
Remdesivir - Pharmacy Brief Note   O:  ALT: 25  CXR: SpO2: 94 % on RA    A/P:  Remdesivir 200 mg IVPB once followed by 100 mg IVPB daily x 4 days.   Aldona Bryner D 02/24/2020 5:04 AM

## 2020-02-24 NOTE — ED Notes (Signed)
Patient ambulated around room with RN and portable pulse ox. Pt notably SOB with increased work of breathing after 4 steps. 5 laps made around room and patient maintained oxygen saturation of 94-96% on room air. However, pt HR went from 108-115 at rest to 136-145 while ambulating. Patient reports HR as high as 180 at home yesterday according to apple watch. MD made aware of findings.

## 2020-02-29 LAB — CULTURE, BLOOD (ROUTINE X 2)
Culture: NO GROWTH
Special Requests: ADEQUATE

## 2020-07-08 IMAGING — CT CT ABD-PELV W/ CM
2 of 4 series · 13 of 46 positions shown, 15 images · IV contrast (iopamidol)
Comparison: None.

CLINICAL DATA: Right upper quadrant abdominal pain since [REDACTED].

Creatinine was obtained on site at [HOSPITAL] at [REDACTED]
[HOSPITAL].
Results: Creatinine 0.9 mg/dL.
EXAM:
CT ABDOMEN AND PELVIS WITH CONTRAST
TECHNIQUE: Multidetector CT imaging of the abdomen and pelvis was performed
using the standard protocol following bolus administration of
intravenous contrast.
CONTRAST:  125mL [id number] IOPAMIDOL ([id number]) INJECTION 61%

[Series 2: abd pelvis 5.00 br40 s3 axial · axial · 0.74mm/px · z∈[+1325,+1775]mm · 10 of 108 slices shown, 12 images]
[im 9/108  soft-tissue]
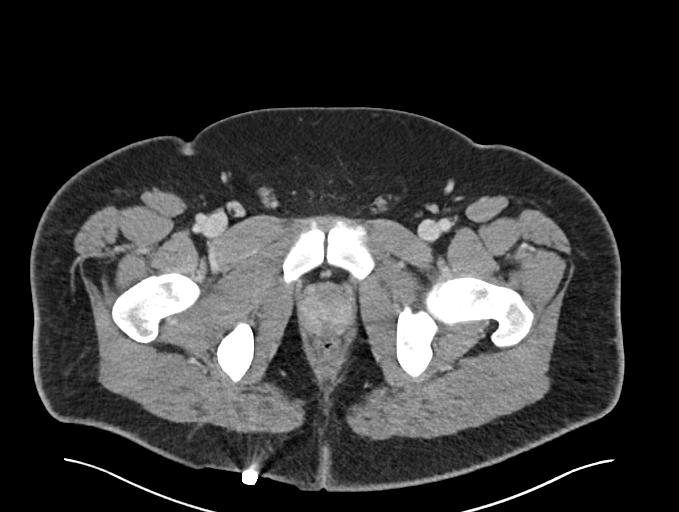
[im 9/108  bone]
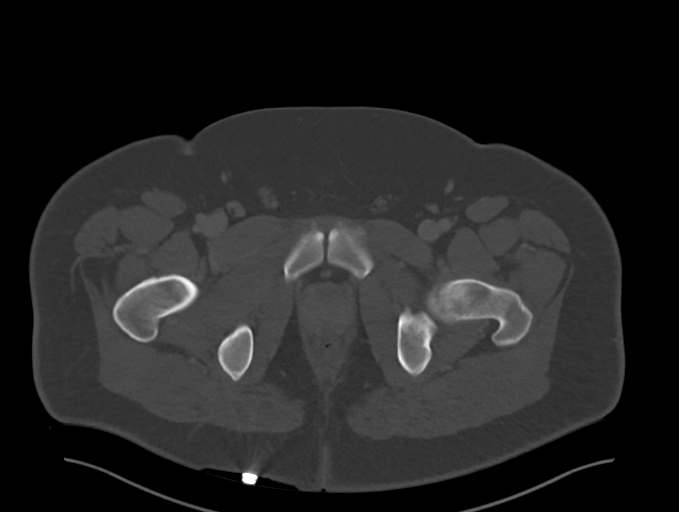
[im 18/108  soft-tissue]
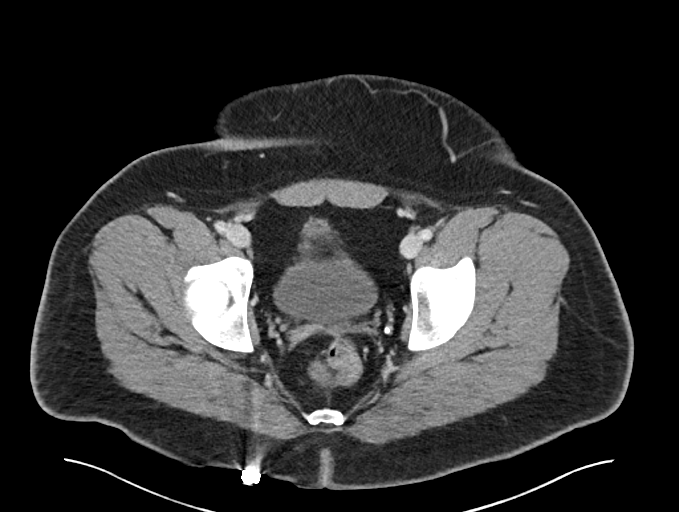
[im 27/108  soft-tissue]
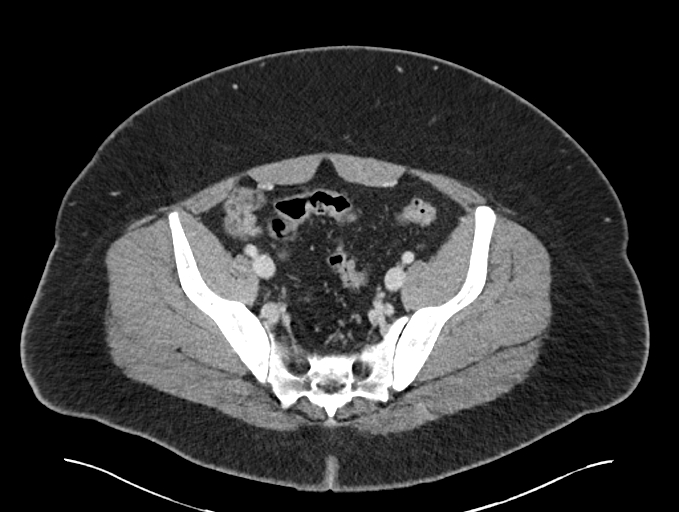
[im 41/108  soft-tissue]
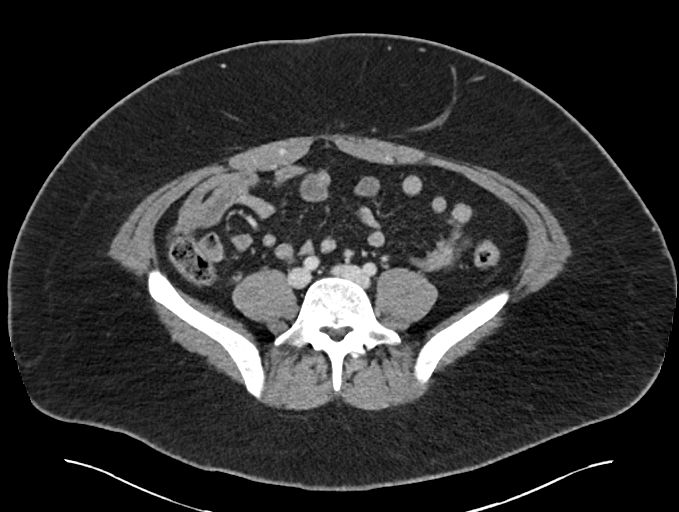
[im 50/108  soft-tissue]
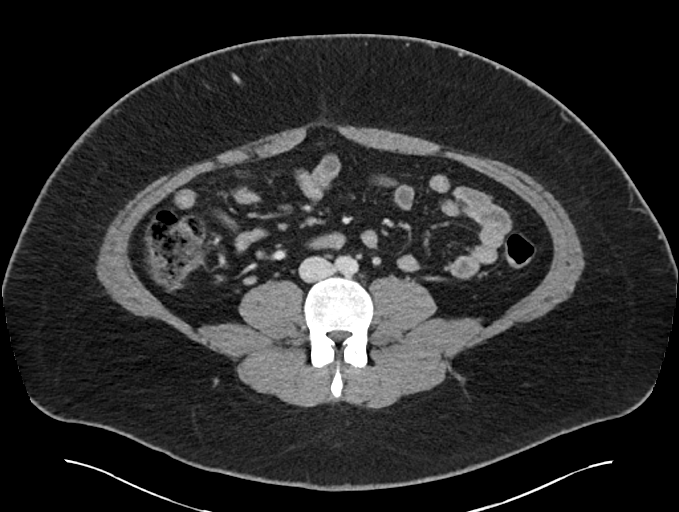
[im 58/108  soft-tissue]
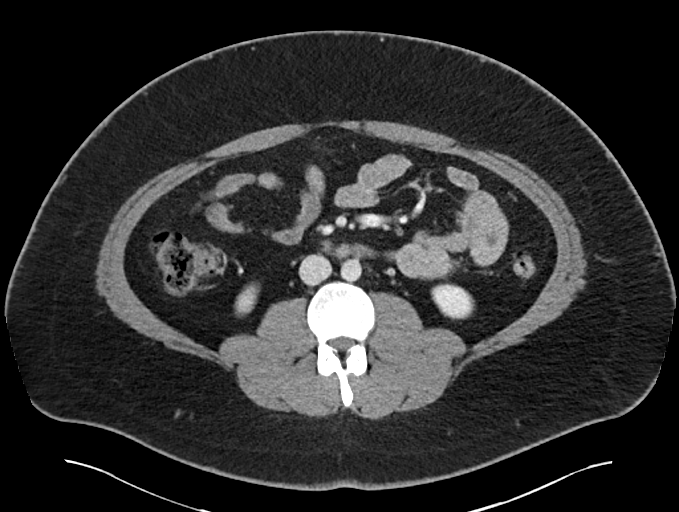
[im 67/108  soft-tissue]
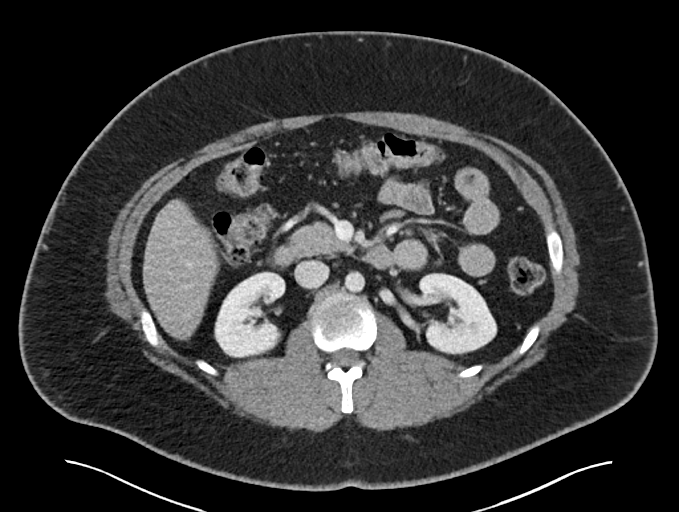
[im 81/108  soft-tissue]
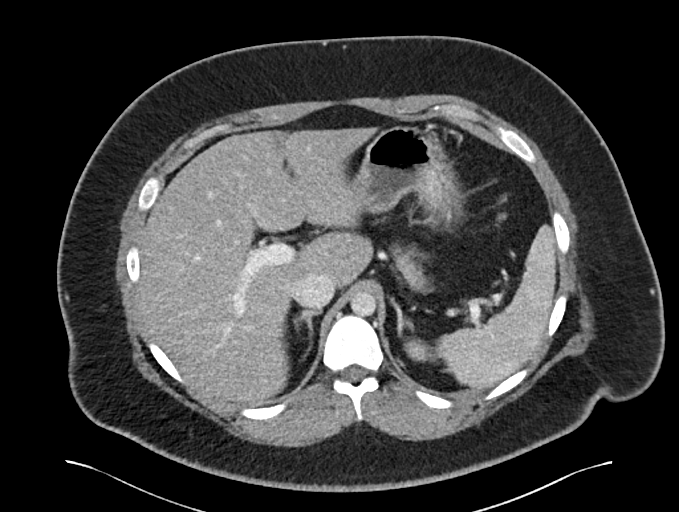
[im 90/108  soft-tissue]
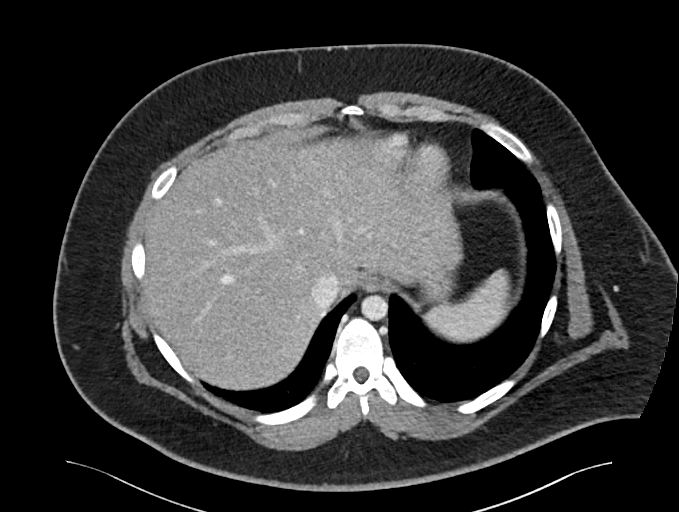
[im 90/108  bone]
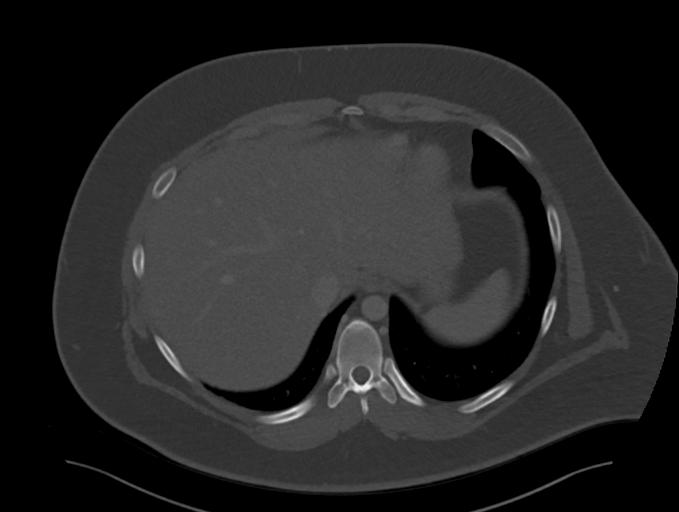
[im 99/108  soft-tissue]
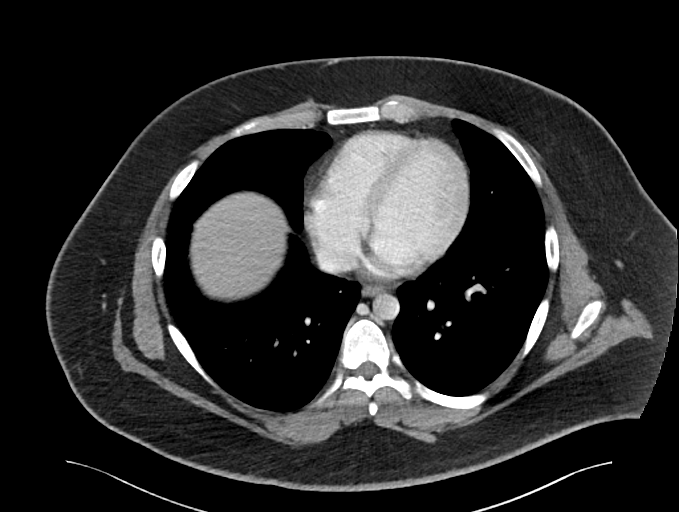

[Series 6: abd pelvis 2.00 br40 s3 cor · coronal · 0.97mm/px · 3 of 172 slices shown]
[im 58/172  soft-tissue]
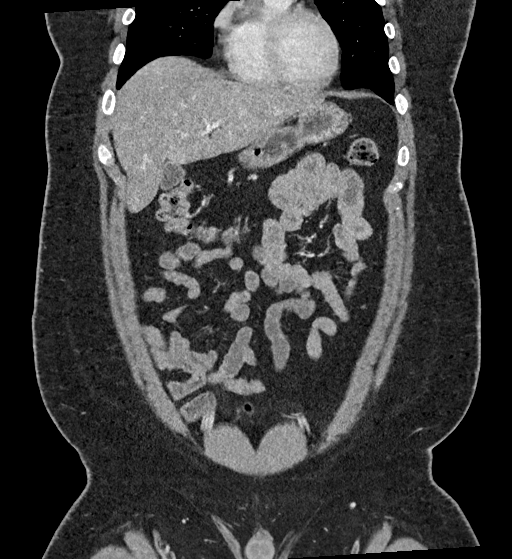
[im 77/172  soft-tissue]
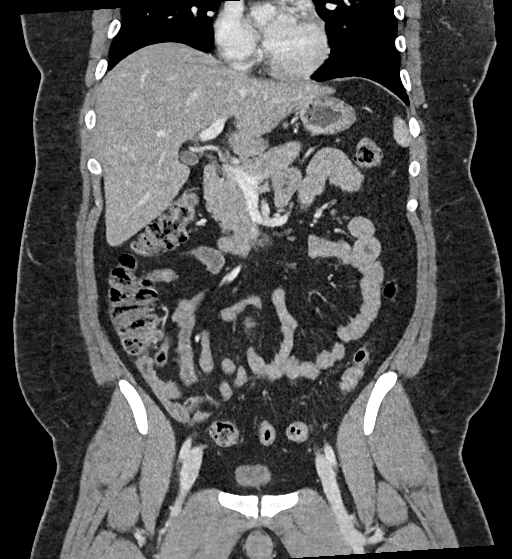
[im 96/172  soft-tissue]
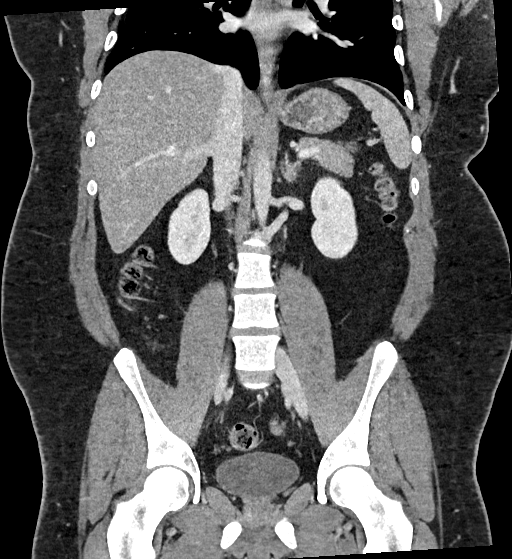

[13 of 46 positions shown; findings below may reference images not displayed]

FINDINGS: Lower chest: The lung bases are clear of acute process. No pleural
effusion or pulmonary lesions. The heart is normal in size. No
pericardial effusion. The distal esophagus and aorta are
unremarkable.

Hepatobiliary: Suspect mild diffuse fatty infiltration of the liver
but no focal hepatic lesions or intrahepatic biliary dilatation. The
portal and hepatic veins are patent. The gallbladder is
unremarkable. No common bile duct dilatation.

Pancreas: No mass, inflammation or ductal dilatation.

Spleen: Normal size.  No focal lesions.

Adrenals/Urinary Tract: The adrenal glands and kidneys are normal.
The bladder is normal.

Stomach/Bowel: The stomach, duodenum, small bowel and colon are
grossly normal without oral contrast. No inflammatory changes, mass
lesions or obstructive findings. The terminal ileum and appendix are
normal.

Vascular/Lymphatic: The aorta is normal in caliber. No dissection.
The branch vessels are patent. The major venous structures are
patent. No mesenteric or retroperitoneal mass or adenopathy. Small
scattered lymph nodes are noted.

Reproductive: The prostate gland and seminal vesicles are
unremarkable.

Other: No pelvic mass or adenopathy. No free pelvic fluid
collections. No inguinal mass or adenopathy. No abdominal wall
hernia or subcutaneous lesions.

Musculoskeletal: No significant bony findings.
IMPRESSION: 1. No acute abdominal/pelvic findings, mass lesions or adenopathy.
2. Suspect mild diffuse fatty infiltration of the liver.

## 2020-07-15 ENCOUNTER — Encounter: Payer: Self-pay | Admitting: Neurology

## 2020-07-15 ENCOUNTER — Institutional Professional Consult (permissible substitution): Payer: 59 | Admitting: Neurology

## 2020-07-16 ENCOUNTER — Encounter: Payer: Self-pay | Admitting: Neurology

## 2021-07-12 IMAGING — CR DG CHEST 2V
2 series · 2 of 2 positions shown · non-contrast
Comparison: Radiograph 09/01/2018

CLINICAL DATA: Shortness of breath, fevers, chills and body aches

EXAM:
CHEST - 2 VIEW

[dg chest 2 view (1 of 2)]
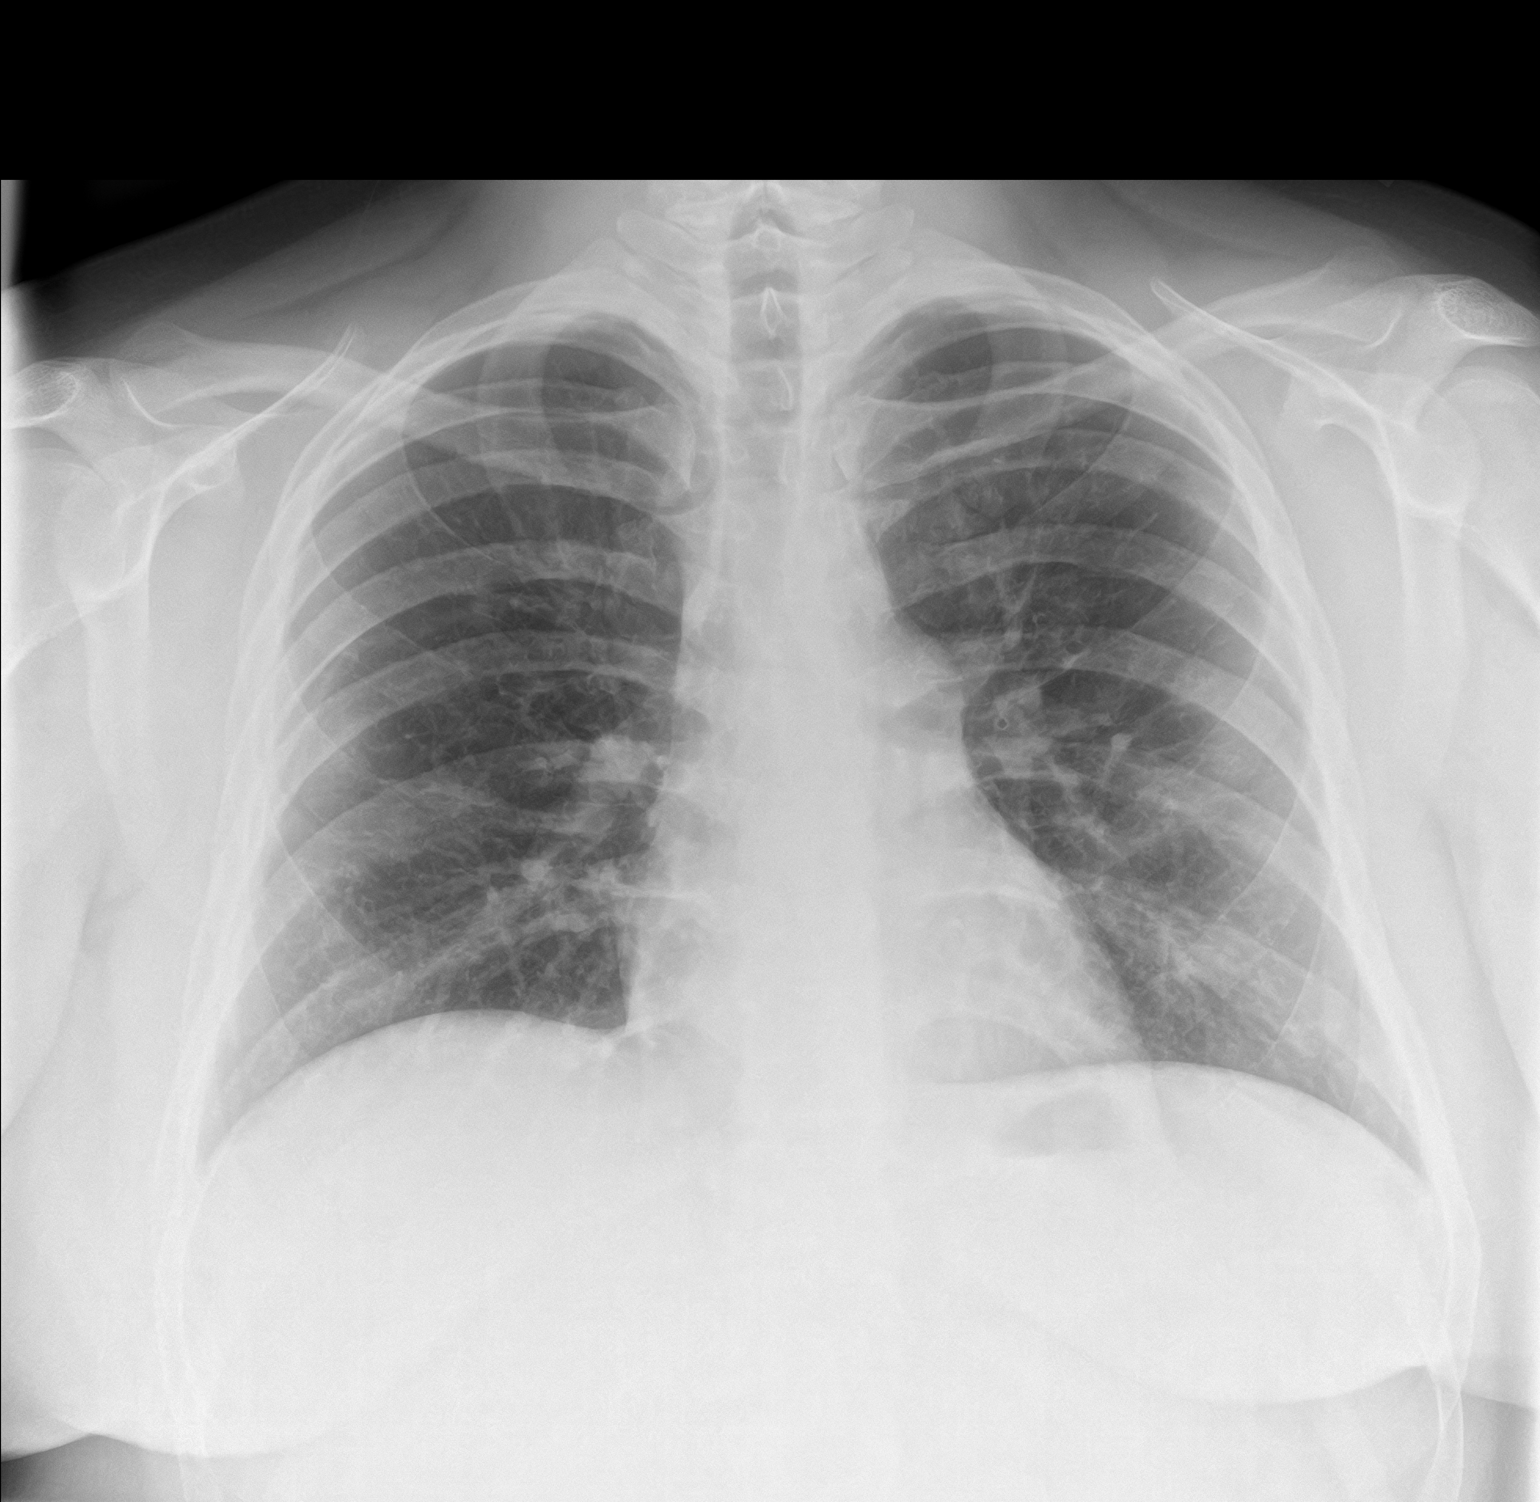

[dg chest 2 view (2 of 2)]
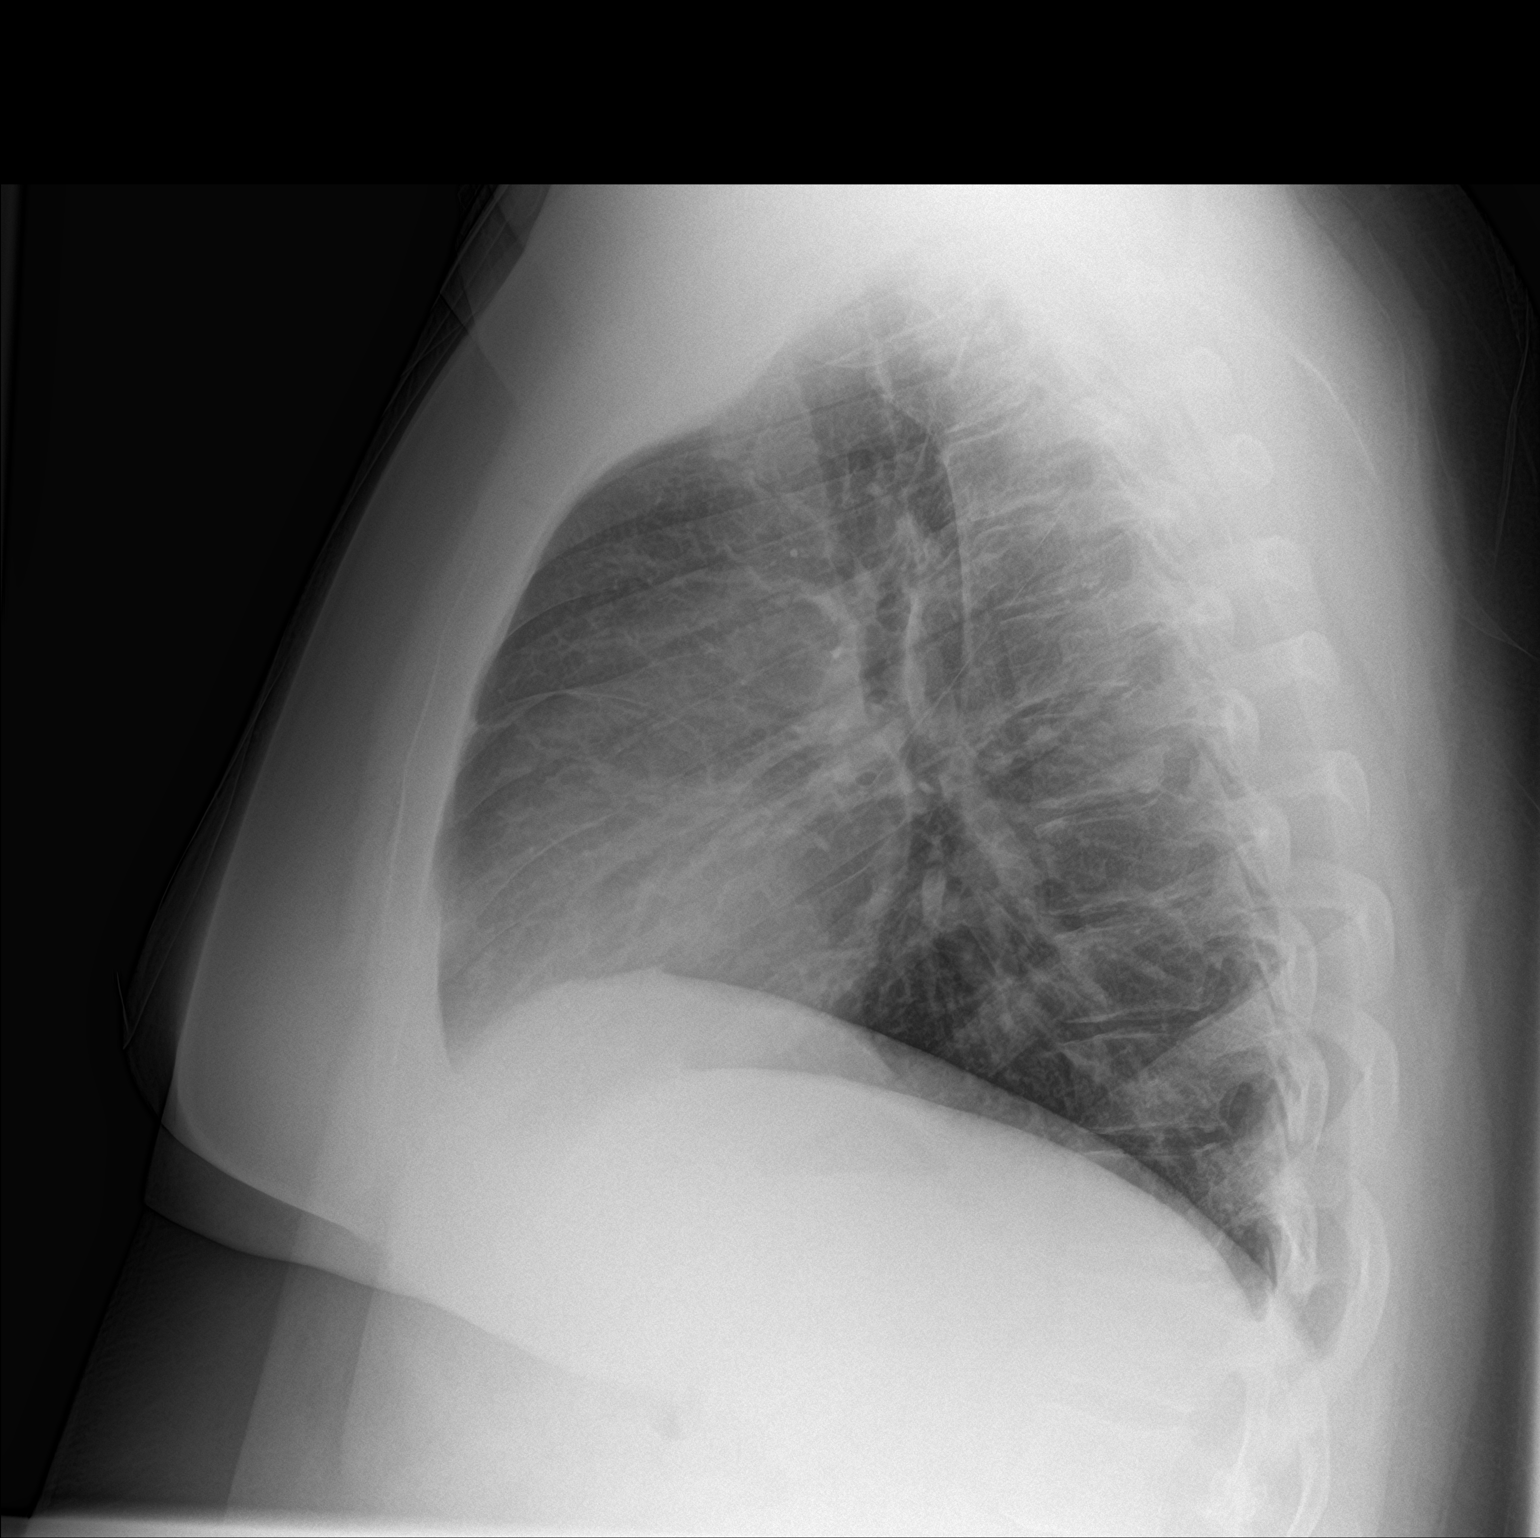

[2 of 2 positions shown; findings below may reference images not displayed]

FINDINGS: Mild airways thickening few ill-defined patchy opacities. No
pneumothorax or visible effusion. The cardiomediastinal contours are
unremarkable. No acute osseous or soft tissue abnormality.
IMPRESSION: Mild airways thickening and few ill-defined patchy opacities, could
reflect early bronchopneumonia or atypical infection in the
appropriate clinical setting.
